# Patient Record
Sex: Male | Born: 1965
Health system: Southern US, Community
[De-identification: ages and names within clinical notes are randomized; demographics above are authoritative.]

## PROBLEM LIST (undated history)

## (undated) DIAGNOSIS — I10 Essential (primary) hypertension: Secondary | ICD-10-CM

## (undated) DIAGNOSIS — G629 Polyneuropathy, unspecified: Secondary | ICD-10-CM

## (undated) DIAGNOSIS — E119 Type 2 diabetes mellitus without complications: Secondary | ICD-10-CM

---

## 2013-12-03 ENCOUNTER — Encounter: Payer: Self-pay | Admitting: Emergency Medicine

## 2013-12-03 ENCOUNTER — Emergency Department (INDEPENDENT_AMBULATORY_CARE_PROVIDER_SITE_OTHER): Payer: Worker's Compensation

## 2013-12-03 ENCOUNTER — Emergency Department (INDEPENDENT_AMBULATORY_CARE_PROVIDER_SITE_OTHER)
Admission: EM | Admit: 2013-12-03 | Discharge: 2013-12-03 | Disposition: A | Discharge status: Home / Self Care | Payer: Worker's Compensation | Source: Home / Self Care | Attending: Emergency Medicine | Admitting: Emergency Medicine

## 2013-12-03 DIAGNOSIS — M25569 Pain in unspecified knee: Secondary | ICD-10-CM

## 2013-12-03 DIAGNOSIS — S8000XA Contusion of unspecified knee, initial encounter: Secondary | ICD-10-CM

## 2013-12-03 DIAGNOSIS — S8002XA Contusion of left knee, initial encounter: Secondary | ICD-10-CM

## 2013-12-03 HISTORY — DX: Essential (primary) hypertension: I10

## 2013-12-03 HISTORY — DX: Polyneuropathy, unspecified: G62.9

## 2013-12-03 HISTORY — DX: Type 2 diabetes mellitus without complications: E11.9

## 2013-12-03 MED ORDER — HYDROCODONE-ACETAMINOPHEN 5-325 MG PO TABS: 1.0000 | ORAL_TABLET | ORAL | Status: DC | PRN

## 2013-12-03 MED ORDER — MELOXICAM 7.5 MG PO TABS: ORAL_TABLET | ORAL | Status: DC

## 2013-12-03 NOTE — ED Notes (Signed)
Pt was working today and was on the back of a truck when it pulled off.  Pt was "forced to jump to the loading dock and when he did, he hit his L knee on the dock.  Pt states this happened at 9-930 AM today.  Pain described as sharp shooting stabbing pain 9/10.  Pain does not radiate it is located only in the L knee.

## 2013-12-03 NOTE — ED Provider Notes (Signed)
CSN: [id number]     Arrival date & time 12/03/13  1446 History   First MD Initiated Contact with Patient 12/03/13 1453     Chief Complaint  Patient presents with  . Knee Injury    L knee at work   [facility] Saturday 12/03/2013. Patient works for [employer] and was referred here by his employer for evaluation and treatment of this on the job injury.  Pt was working today and was on the back of a truck, unloading the truck, when the truck unexpectedly pulled off. Patient states: "I was forced to jump to the loading dock and when I did, I hit my L knee on the dock."  Pt states this happened at 9-930 AM today.  Pain describes sharp shooting stabbing pain 9/10. Pain does not radiate. It is located only in the L knee  Patient is a 48 y.o. male presenting with knee pain.  Knee Pain Location:  Knee Time since incident:  6 hours Injury: yes   Mechanism of injury: fall   Fall:    Fall occurred: On the loading dock.   Point of impact: Left knee.   Entrapped after fall: no   Knee location:  L knee (Especially hurts medial aspect left knee) Pain details:    Quality:  Sharp and shooting (And stabbing)   Radiates to:  Does not radiate   Severity:  Severe (9/10)   Progression:  Unchanged Chronicity:  New Dislocation: no   Foreign body present:  No foreign bodies Prior injury to area:  No Relieved by:  Nothing Worsened by:  Activity, bearing weight, flexion and extension Ineffective treatments:  None tried Associated symptoms: decreased ROM, stiffness and swelling   Associated symptoms: no back pain, no fever, no muscle weakness, no neck pain, no numbness and no tingling   Risk factors: no known bone disorder and no recent illness      Past Medical History  Diagnosis Date  . Diabetes mellitus without complication   . Hypertension   . Neuropathy    History reviewed. No pertinent past surgical history. History reviewed. No pertinent family history. History   Substance Use Topics  . Smoking status: Never Smoker   . Smokeless tobacco: Never Used  . Alcohol Use: No    Review of Systems  Constitutional: Negative for fever.  Musculoskeletal: Positive for stiffness. Negative for back pain and neck pain.  All other systems reviewed and are negative.   see also history of present illness  Allergies  Review of patient's allergies indicates no known allergies.  Home Medications   Current Outpatient Rx  Name  Route  Sig  Dispense  Refill  . gabapentin (NEURONTIN) 100 MG capsule   Oral   Take 100 mg by mouth 3 (three) times daily.         Marland Kitchen. lisinopril (PRINIVIL,ZESTRIL) 10 MG tablet   Oral   Take 20 mg by mouth daily.         . metFORMIN (GLUCOPHAGE) 1000 MG tablet   Oral   Take 1,000 mg by mouth 2 (two) times daily with a meal.         . potassium chloride (K-DUR,KLOR-CON) 10 MEQ tablet   Oral   Take 10 mEq by mouth 2 (two) times daily.         Marland Kitchen. HYDROcodone-acetaminophen (NORCO/VICODIN) 5-325 MG per tablet   Oral   Take 1-2 tablets by mouth every 4 (four) hours as needed for severe  pain. Take with food.   12 tablet   0   . meloxicam (MOBIC) 7.5 MG tablet      Take 1 twice a day as needed for pain. Take with food. (Do not take with any other NSAID.)   20 tablet   0    BP 154/92  Pulse 104  Temp(Src) 97.9 F (36.6 C) (Oral)  Ht 5\' 10"  (1.778 m)  SpO2 95% Physical Exam  Nursing note and vitals reviewed. Constitutional: He is oriented to person, place, and time. He appears well-developed and well-nourished. No distress.  Alert, cooperative, overweight male, in moderate discomfort from left knee pain. When he tries to weight-bear, he has severe left knee pain. He moves slowly to avoid moving right knee. He can partially weight-bear, only with severe pain.   HENT:  Head: Normocephalic and atraumatic.  Eyes: Conjunctivae and EOM are normal. Pupils are equal, round, and reactive to light. No scleral icterus.  Neck:  Normal range of motion.  Cardiovascular: Normal rate.   Pulmonary/Chest: Effort normal.  Abdominal: He exhibits no distension.  Musculoskeletal:       Left knee: He exhibits decreased range of motion (markedly decreased range of motion) and swelling. He exhibits no effusion, no ecchymosis, no deformity, no laceration, no erythema, no LCL laxity and normal patellar mobility. Tenderness (Severe tenderness, Anterior and medial) found. Medial joint line tenderness noted. No lateral joint line and no patellar tendon tenderness noted.       Cervical back: He exhibits no tenderness, no bony tenderness and no deformity.       Thoracic back: He exhibits no tenderness, no bony tenderness and no deformity.       Lumbar back: He exhibits no tenderness, no bony tenderness and no deformity.       Left lower leg: He exhibits no tenderness.  Because of severe pain and tenderness left medial knee, I could not completely assess whether or not there was any ligamentous instability. From my examination, there was no definite ligamentous instability. Negative McMurray's sign. Negative Lachman sign.  Neurological: He is alert and oriented to person, place, and time.  Skin: Skin is warm.  Psychiatric: He has a normal mood and affect.   Left knee without any open wound or discoloration. There is exquisite point tenderness left medial knee. Decreased range of motion left knee. Lachman's and McMurray's test negative.  There is1+ edema with 3+ varicose veins both legs, which he states is chronic. No calf tenderness or cords or heat. Negative Homans sign. Neurovascular distally both lower extremities intact. ED Course  Procedures  Imaging Review Dg Knee Complete 4 Views Left  12/03/2013   CLINICAL DATA:  Pain post trauma  EXAM: LEFT KNEE - COMPLETE 4+ VIEW  COMPARISON:  None.  FINDINGS: Frontal, lateral, and bilateral oblique views were obtained. There is no fracture or dislocation. There is no appreciable effusion.  There is moderately severe narrowing medially and in the patellofemoral joint region. There is spurring medially and in the patellofemoral joint. There is a small bone island in the proximal tibia.  IMPRESSION: Osteoarthritic change.  No fracture or appreciable joint effusion.   Electronically Signed   By: Bretta Bang M.D.   On: 12/03/2013 16:16    MDM   1. Contusion of left knee    Treatment options discussed, as well as risks, benefits, alternatives. Patient voiced understanding and agreement with the following plans: We applied an elastic knee sleeve/brace. Advised ice and elevation of left lower  extremity x24 hours. Written prescriptions given: Vicodin 5/325. #12. No refills. 1 or 2 every 4 hours when necessary severe pain. Precautions discussed that this may cause drowsiness.--He understands not to take this while working or driving. Meloxicam 7.5 mg. #20. No refills. 1 by mouth twice a day with food for pain/inflammation.  Worker's Comp Information:  I completed the [employer] Report, please see that sheet for further specific information. I made a photocopy of that report, and gave patient the original to give to his employer.  Return To Work:  Monday, 12/05/2013   Work Restrictions:  Light/sit down work only. 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  FOLLOW-UP APPOINTMENT:  I gave him appointment time Tues 12/06/13 9 AM at: [facility] [phone] [address], [address]  [place], [place] [address]   Referral (if indicated): I did not make any other referral at this time. At his followup appointment at employer health services, the provider there may elect to refer to an orthopedic specialist if symptoms not improving.   Lajean Manes, MD 12/03/13 1911

## 2015-10-31 ENCOUNTER — Ambulatory Visit (INDEPENDENT_AMBULATORY_CARE_PROVIDER_SITE_OTHER): Payer: BLUE CROSS/BLUE SHIELD | Admitting: Sports Medicine

## 2015-10-31 ENCOUNTER — Encounter: Payer: Self-pay | Admitting: Sports Medicine

## 2015-10-31 DIAGNOSIS — M79672 Pain in left foot: Secondary | ICD-10-CM | POA: Diagnosis not present

## 2015-10-31 DIAGNOSIS — L600 Ingrowing nail: Secondary | ICD-10-CM | POA: Diagnosis not present

## 2015-10-31 DIAGNOSIS — E1142 Type 2 diabetes mellitus with diabetic polyneuropathy: Secondary | ICD-10-CM

## 2015-10-31 DIAGNOSIS — B351 Tinea unguium: Secondary | ICD-10-CM

## 2015-10-31 DIAGNOSIS — M79671 Pain in right foot: Secondary | ICD-10-CM

## 2015-10-31 NOTE — Progress Notes (Signed)
Patient ID: Scott Anderson, male   DOB: Jan 18, 1966, 50 y.o.   MRN: [id number] Subjective: Scott Anderson is a 50 y.o. male patient with history of type 2 diabetes who presents to office today complaining of long, painful nails  while ambulating in shoes; unable to trim. Patient states that the glucose reading this morning was >200 mg/dl. Diagnosed with DM <10 years ago. Patient denies any new changes in medication or new problems. Patient denies any new cramping, numbness, burning or tingling in the legs. Admits to chronic neuropathic pain for over 5 years.  There are no active problems to display for this patient.  Current Outpatient Prescriptions on File Prior to Visit  Medication Sig Dispense Refill  . gabapentin (NEURONTIN) 100 MG capsule Take 100 mg by mouth 3 (three) times daily.    Marland Kitchen lisinopril (PRINIVIL,ZESTRIL) 10 MG tablet Take 20 mg by mouth daily.    . metFORMIN (GLUCOPHAGE) 1000 MG tablet Take 1,000 mg by mouth 2 (two) times daily with a meal.    . potassium chloride (K-DUR,KLOR-CON) 10 MEQ tablet Take 10 mEq by mouth 2 (two) times daily.     No current facility-administered medications on file prior to visit.   No Known Allergies  No results found for this or any previous visit (from the past 2160 hour(s)).  Objective: General: Patient is awake, alert, and oriented x 3 and in no acute distress.  Integument: Skin is warm, dry and supple bilateral. Nails are tender, long, thickened and  dystrophic with subungual debris, consistent with onychomycosis, 1-5 bilateral. At right hallux medial and lateral nail margins there is slight incurvation and ingrowing with dry debris's with no surrounding signs of infection, consistent with early ingrowing nail. No open lesions or preulcerative lesions present bilateral. Remaining integument unremarkable.  Vasculature:  Dorsalis Pedis pulse 24 bilateral. Posterior Tibial pulse  1/4 bilateral.  Capillary fill time <3 sec 1-5 bilateral. Scant hair  growth to the level of the digits. Temperature gradient within normal limits. No varicosities present bilateral. No edema present bilateral.   Neurology: The patient has intact sensation measured with a 5.07/10g Semmes Weinstein Monofilament at all pedal sites bilateral . Vibratory sensation diminished bilateral with tuning fork. No Babinski sign present bilateral.   Musculoskeletal: No gross pedal deformities noted bilateral. Muscular strength 5/5 in all lower extremity muscular groups bilateral without pain or limitation on range of motion . No tenderness with calf compression bilateral.  Assessment and Plan: Problem List Items Addressed This Visit    None    Visit Diagnoses    Dermatophytosis of nail    -  Primary    Ingrown nail        right hallux    Diabetic polyneuropathy associated with type 2 diabetes mellitus (HCC)        Relevant Medications    NOVOLOG FLEXPEN 100 UNIT/ML FlexPen    LANTUS SOLOSTAR 100 UNIT/ML Solostar Pen    diazepam (VALIUM) 5 MG tablet    gabapentin (NEURONTIN) 300 MG capsule    lisinopril (PRINIVIL,ZESTRIL) 40 MG tablet    metFORMIN (GLUCOPHAGE-XR) 500 MG 24 hr tablet    Foot pain, bilateral           -Examined patient. -Discussed and educated patient on diabetic foot care, especially with  regards to the vascular, neurological and musculoskeletal systems.  -Stressed the importance of good glycemic control and the detriment of not  controlling glucose levels in relation to the foot. -Mechanically debrided all nails 1-5  bilateral using sterile nail nipper and filed with dremel without incident. Applied Neosporin and Band-Aid to right hallux nail and advised patient to closely monitor for reoccurrence of ingrown or worsening.  -Answered all patient questions -Patient to return as needed or in 3 months for at risk foot care -Patient advised to call the office if any problems or questions arise in the  Meantime.  Scott Anderson, DPM

## 2015-10-31 NOTE — Patient Instructions (Signed)
Diabetes and Foot Care Diabetes may cause you to have problems because of poor blood supply (circulation) to your feet and legs. This may cause the skin on your feet to become thinner, break easier, and heal more slowly. Your skin may become dry, and the skin may peel and crack. You may also have nerve damage in your legs and feet causing decreased feeling in them. You may not notice minor injuries to your feet that could lead to infections or more serious problems. Taking care of your feet is one of the most important things you can do for yourself.  HOME CARE INSTRUCTIONS  Wear shoes at all times, even in the house. Do not go barefoot. Bare feet are easily injured.  Check your feet daily for blisters, cuts, and redness. If you cannot see the bottom of your feet, use a mirror or ask someone for help.  Wash your feet with warm water (do not use hot water) and mild soap. Then pat your feet and the areas between your toes until they are completely dry. Do not soak your feet as this can dry your skin.  Apply a moisturizing lotion or petroleum jelly (that does not contain alcohol and is unscented) to the skin on your feet and to dry, brittle toenails. Do not apply lotion between your toes.  Trim your toenails straight across. Do not dig under them or around the cuticle. File the edges of your nails with an emery board or nail file.  Do not cut corns or calluses or try to remove them with medicine.  Wear clean socks or stockings every day. Make sure they are not too tight. Do not wear knee-high stockings since they may decrease blood flow to your legs.  Wear shoes that fit properly and have enough cushioning. To break in new shoes, wear them for just a few hours a day. This prevents you from injuring your feet. Always look in your shoes before you put them on to be sure there are no objects inside.  Do not cross your legs. This may decrease the blood flow to your feet.  If you find a minor scrape,  cut, or break in the skin on your feet, keep it and the skin around it clean and dry. These areas may be cleansed with mild soap and water. Do not cleanse the area with peroxide, alcohol, or iodine.  When you remove an adhesive bandage, be sure not to damage the skin around it.  If you have a wound, look at it several times a day to make sure it is healing.  Do not use heating pads or hot water bottles. They may burn your skin. If you have lost feeling in your feet or legs, you may not know it is happening until it is too late.  Make sure your health care provider performs a complete foot exam at least annually or more often if you have foot problems. Report any cuts, sores, or bruises to your health care provider immediately. SEEK MEDICAL CARE IF:   You have an injury that is not healing.  You have cuts or breaks in the skin.  You have an ingrown nail.  You notice redness on your legs or feet.  You feel burning or tingling in your legs or feet.  You have pain or cramps in your legs and feet.  Your legs or feet are numb.  Your feet always feel cold. SEEK IMMEDIATE MEDICAL CARE IF:   There is increasing redness,   swelling, or pain in or around a wound.  There is a red line that goes up your leg.  Pus is coming from a wound.  You develop a fever or as directed by your health care provider.  You notice a bad smell coming from an ulcer or wound.   This information is not intended to replace advice given to you by your health care provider. Make sure you discuss any questions you have with your health care provider.   Document Released: 08/15/2000 Document Revised: 04/20/2013 Document Reviewed: 01/25/2013 Elsevier Interactive Patient Education 2016 Elsevier Inc.  

## 2015-11-26 ENCOUNTER — Ambulatory Visit (INDEPENDENT_AMBULATORY_CARE_PROVIDER_SITE_OTHER): Payer: Worker's Compensation | Admitting: Endocrinology

## 2015-11-26 ENCOUNTER — Encounter: Payer: Self-pay | Admitting: Endocrinology

## 2015-11-26 VITALS — BP 137/86 | HR 100 | Temp 97.9°F | Ht 68.0 in | Wt 321.0 lb

## 2015-11-26 DIAGNOSIS — Z794 Long term (current) use of insulin: Secondary | ICD-10-CM

## 2015-11-26 DIAGNOSIS — I10 Essential (primary) hypertension: Secondary | ICD-10-CM

## 2015-11-26 DIAGNOSIS — E119 Type 2 diabetes mellitus without complications: Secondary | ICD-10-CM | POA: Insufficient documentation

## 2015-11-26 DIAGNOSIS — E1142 Type 2 diabetes mellitus with diabetic polyneuropathy: Secondary | ICD-10-CM

## 2015-11-26 DIAGNOSIS — F411 Generalized anxiety disorder: Secondary | ICD-10-CM

## 2015-11-26 DIAGNOSIS — E781 Pure hyperglyceridemia: Secondary | ICD-10-CM

## 2015-11-26 DIAGNOSIS — E039 Hypothyroidism, unspecified: Secondary | ICD-10-CM

## 2015-11-26 DIAGNOSIS — N529 Male erectile dysfunction, unspecified: Secondary | ICD-10-CM

## 2015-11-26 MED ORDER — INSULIN GLARGINE 100 UNIT/ML SOLOSTAR PEN: 130.0000 [IU] | PEN_INJECTOR | SUBCUTANEOUS | Status: DC

## 2015-11-26 NOTE — Patient Instructions (Addendum)
good diet and exercise significantly improve the control of your diabetes.  please let me know if you wish to be referred to a dietician.  high blood sugar is very risky to your health.  you should see an eye doctor and dentist every year.  It is very important to get all recommended vaccinations.  controlling your blood pressure and cholesterol drastically reduces the damage diabetes does to your body.  Those who smoke should quit.  please discuss these with your doctor.  check your blood sugar twice a day.  vary the time of day when you check, between before the 3 meals, and at bedtime.  also check if you have symptoms of your blood sugar being too high or too low.  please keep a record of the readings and bring it to your next appointment here (or you can bring the meter itself).  You can write it on any piece of paper.  please call us sooner if your blood sugar goes below 70, or if you have a lot of readings over 200.  Please consider having weight loss surgery.  It is good for your health.  Here is some information about it.  If you decide to consider further, please call the phone number in the papers, and register for a free informational meeting Please increase the lantus to 130 units each morning (you can take it all in the morning).   Please continue the same novolog for now.  Please call us in a few days, to tell us how the blood sugar is doing.   Please come back for a follow-up appointment in 1 month.

## 2015-11-26 NOTE — Progress Notes (Signed)
Subjective:    Patient ID: Scott Anderson, male    DOB: Aug 09, 1966, 50 y.o.   MRN: [id number]  HPI pt states DM was dx'ed in 2010; he has moderate neuropathy of the lower extremities, and assoc pain; he is unaware of any associated chronic complications; he has been on insulin since dx; pt says his diet and exercise are; he has never had pancreatitis, severe hypoglycemia or DKA.  He says cbg's vary from 200-400.  There is no trend throughout the day.   Past Medical History  Diagnosis Date  . Diabetes mellitus without complication (HCC)   . Hypertension   . Neuropathy (HCC)     No past surgical history on file.  Social History   Social History  . Marital Status: Married    Spouse Name: N/A  . Number of Children: N/A  . Years of Education: N/A   Occupational History  . Not on file.   Social History Main Topics  . Smoking status: Never Smoker   . Smokeless tobacco: Never Used  . Alcohol Use: No  . Drug Use: No  . Sexual Activity: Not on file   Other Topics Concern  . Not on file   Social History Narrative    Current Outpatient Prescriptions on File Prior to Visit  Medication Sig Dispense Refill  . diazepam (VALIUM) 5 MG tablet TK 1 T PO  TID PRN  0  . fenofibrate 160 MG tablet TK 1 T PO D  3  . gabapentin (NEURONTIN) 300 MG capsule 3 (three) times daily.   1  . levothyroxine (SYNTHROID, LEVOTHROID) 25 MCG tablet TK 1 T PO D  5  . lisinopril (PRINIVIL,ZESTRIL) 40 MG tablet TK 1 T PO D  5  . metFORMIN (GLUCOPHAGE) 1000 MG tablet Take 1,000 mg by mouth 2 (two) times daily with a meal.    . metoprolol tartrate (LOPRESSOR) 25 MG tablet   4  . NOVOLOG FLEXPEN 100 UNIT/ML FlexPen 23 units in the morning 30 units at night  4  . PROAIR HFA 108 (90 Base) MCG/ACT inhaler INL 2 PFS PO Q 6 H PRN  5  . potassium chloride (K-DUR,KLOR-CON) 10 MEQ tablet Take 10 mEq by mouth 2 (two) times daily. Reported on 11/26/2015     No current facility-administered medications on file prior to  visit.    No Known Allergies  Family History  Problem Relation Age of Onset  . Diabetes Mother     BP 137/86 mmHg  Pulse 100  Temp(Src) 97.9 F (36.6 C) (Oral)  Ht  (1.727 m)  Wt 321 lb (145.605 kg)  BMI 48.82 kg/m2  SpO2 97%  Review of Systems denies weight loss, blurry vision, chest pain, n/v, muscle cramps, excessive diaphoresis, cold intolerance, and easy bruising.  He has intermittent headache, polyuria, rhinorrhea, and DOE.  Wife says he has intermittent memory loss.     Objective:   Physical Exam VS: see vs page GEN: no distress.  Morbid obesity HEAD: head: no deformity eyes: no periorbital swelling, no proptosis external nose and ears are normal mouth: no lesion seen NECK: supple, thyroid is not enlarged CHEST WALL: no deformity LUNGS: clear to auscultation BREASTS:  No gynecomastia CV: reg rate and rhythm, no murmur ABD: abdomen is soft, nontender.  no hepatosplenomegaly.  not distended.  no hernia.   MUSCULOSKELETAL: muscle bulk and strength are grossly normal.  no obvious joint swelling.  gait is steady, with a cane.   EXTEMITIES: no  deformity.  no ulcer on the feet.  feet are of normal color and temp.  1+ bilat leg edema, bilat varicosities, and erythema of the legs.  There is bilateral onychomycosis of the toenails, and dry skin.  PULSES: dorsalis pedis intact bilat.  no carotid bruit.  NEURO:  cn 2-12 grossly intact.   readily moves all 4's.  sensation is intact to touch on the feet, but decreased from normal SKIN:  Normal texture and temperature.  No rash or suspicious lesion is visible.   NODES:  None palpable at the neck PSYCH: alert, well-oriented.  Does not appear anxious nor depressed.   outside test results are reviewed: A1c=13.7%  I have reviewed outside records, and summarized: Pt was noted to have severely elevated a1c, and referred here.      Assessment & Plan:  DM: severe exacerbation.   Morbid obesity: new to  me. Hypertriglyceridemia: this will improve with improvement in glycemic control HTN: well-controlled.  Please continue the same medication.  Patient is advised the following: Patient Instructions  good diet and exercise significantly improve the control of your diabetes.  please let me know if you wish to be referred to a dietician.  high blood sugar is very risky to your health.  you should see an eye doctor and dentist every year.  It is very important to get all recommended vaccinations.  controlling your blood pressure and cholesterol drastically reduces the damage diabetes does to your body.  Those who smoke should quit.  please discuss these with your doctor.  check your blood sugar twice a day.  vary the time of day when you check, between before the 3 meals, and at bedtime.  also check if you have symptoms of your blood sugar being too high or too low.  please keep a record of the readings and bring it to your next appointment here (or you can bring the meter itself).  You can write it on any piece of paper.  please call us sooner if your blood sugar goes below 70, or if you have a lot of readings over 200.  Please consider having weight loss surgery.  It is good for your health.  Here is some information about it.  If you decide to consider further, please call the phone number in the papers, and register for a free informational meeting Please increase the lantus to 130 units each morning (you can take it all in the morning).   Please continue the same novolog for now.  Please call us in a few days, to tell us how the blood sugar is doing.   Please come back for a follow-up appointment in 1 month.

## 2015-11-27 LAB — TESTOSTERONE,FREE AND TOTAL
TESTOSTERONE: 79 ng/dL — AB (ref 348–1197)
Testosterone, Free: 6.3 pg/mL — ABNORMAL LOW (ref 7.2–24.0)

## 2015-11-29 ENCOUNTER — Other Ambulatory Visit: Payer: Self-pay | Admitting: Endocrinology

## 2015-11-29 DIAGNOSIS — E291 Testicular hypofunction: Secondary | ICD-10-CM | POA: Insufficient documentation

## 2015-11-30 LAB — SPECIMEN STATUS REPORT

## 2015-12-04 LAB — PROLACTIN

## 2015-12-04 LAB — TSH: TSH: 5.89 u[IU]/mL — AB (ref 0.450–4.500)

## 2015-12-04 LAB — LUTEINIZING HORMONE

## 2015-12-04 LAB — SPECIMEN STATUS REPORT

## 2015-12-05 ENCOUNTER — Telehealth: Payer: Self-pay | Admitting: Endocrinology

## 2015-12-05 NOTE — Telephone Encounter (Signed)
Patient has a lab appointment this Friday and want to know why? And what is prolactin?

## 2015-12-05 NOTE — Telephone Encounter (Signed)
I contacted the pt and advise we are doing more blood test to try and determine why his testosterone was low from his recent blood test on 11/29/2015. Pt voiced understanding,.

## 2015-12-07 ENCOUNTER — Other Ambulatory Visit (INDEPENDENT_AMBULATORY_CARE_PROVIDER_SITE_OTHER): Payer: BLUE CROSS/BLUE SHIELD

## 2015-12-07 DIAGNOSIS — E291 Testicular hypofunction: Secondary | ICD-10-CM | POA: Diagnosis not present

## 2015-12-07 LAB — LUTEINIZING HORMONE: LH: 5.44 m[IU]/mL (ref 1.50–9.30)

## 2015-12-08 LAB — PROLACTIN: Prolactin: 6.5 ng/mL (ref 2.0–18.0)

## 2015-12-11 ENCOUNTER — Telehealth: Payer: Self-pay | Admitting: Endocrinology

## 2015-12-11 ENCOUNTER — Other Ambulatory Visit: Payer: Self-pay | Admitting: Endocrinology

## 2015-12-11 DIAGNOSIS — E230 Hypopituitarism: Secondary | ICD-10-CM

## 2015-12-11 NOTE — Telephone Encounter (Signed)
Please call pt back.

## 2015-12-11 NOTE — Telephone Encounter (Signed)
I contacted the pt and advise based on the recent blood test Dr. Everardo AllEllison would like to check a MRI of the brain to determine why the testosterone levels are low. Pt voiced understanding. Pt [facility] and will contact the pt to schedule. Pt voiced understanding.

## 2015-12-19 ENCOUNTER — Telehealth: Payer: Self-pay | Admitting: Endocrinology

## 2015-12-19 NOTE — Telephone Encounter (Signed)
error 

## 2015-12-28 ENCOUNTER — Ambulatory Visit
Admission: RE | Admit: 2015-12-28 | Discharge: 2015-12-28 | Disposition: A | Discharge status: Home / Self Care | Payer: BLUE CROSS/BLUE SHIELD | Source: Ambulatory Visit | Attending: Endocrinology | Admitting: Endocrinology

## 2015-12-28 ENCOUNTER — Ambulatory Visit: Payer: Self-pay | Admitting: Endocrinology

## 2015-12-28 DIAGNOSIS — E230 Hypopituitarism: Secondary | ICD-10-CM

## 2015-12-28 MED ORDER — GADOBENATE DIMEGLUMINE 529 MG/ML IV SOLN
10.0000 mL | Freq: Once | INTRAVENOUS | Status: AC | PRN
  Administered 2015-12-28: 10 mL via INTRAVENOUS

## 2015-12-29 ENCOUNTER — Other Ambulatory Visit: Payer: Self-pay | Admitting: Endocrinology

## 2015-12-29 MED ORDER — CLOMIPHENE CITRATE 50 MG PO TABS: ORAL_TABLET | ORAL | Status: DC

## 2016-01-09 ENCOUNTER — Encounter: Payer: Self-pay | Admitting: Endocrinology

## 2016-01-09 ENCOUNTER — Ambulatory Visit (INDEPENDENT_AMBULATORY_CARE_PROVIDER_SITE_OTHER): Payer: BLUE CROSS/BLUE SHIELD | Admitting: Endocrinology

## 2016-01-09 VITALS — BP 132/80 | HR 108 | Temp 97.8°F | Ht 68.0 in | Wt 316.0 lb

## 2016-01-09 DIAGNOSIS — E1142 Type 2 diabetes mellitus with diabetic polyneuropathy: Secondary | ICD-10-CM | POA: Diagnosis not present

## 2016-01-09 DIAGNOSIS — Z794 Long term (current) use of insulin: Secondary | ICD-10-CM

## 2016-01-09 MED ORDER — NOVOLOG FLEXPEN 100 UNIT/ML ~~LOC~~ SOPN: 40.0000 [IU] | PEN_INJECTOR | Freq: Two times a day (BID) | SUBCUTANEOUS | Status: DC

## 2016-01-09 NOTE — Patient Instructions (Addendum)
check your blood sugar twice a day.  vary the time of day when you check, between before the 3 meals, and at bedtime.  also check if you have symptoms of your blood sugar being too high or too low.  please keep a record of the readings and bring it to your next appointment here (or you can bring the meter itself).  You can write it on any piece of paper.  please call us sooner if your blood sugar goes below 70, or if you have a lot of readings over 200.  Please continue to pursue the weight loss surgery.   Please continue the same lantus, and:    Please increase the novolog to 40 units twice a day (with meals).   Please come back for a follow-up appointment in 1 month, when you'll be due to recheck the testosterone.

## 2016-01-09 NOTE — Progress Notes (Signed)
Subjective:    Patient ID: Scott Anderson, male    DOB: 12-Dec-1965, 50 y.o.   MRN: [id number]  HPI  Pt returns for f/u of diabetes mellitus: DM type: Insulin-requiring type 2 Dx'ed: 2010 Complications: painful polyneuropathy  Therapy: insulin since dx.  DKA: never Severe hypoglycemia: never Pancreatitis: never Other: he takes multiple daily injections Interval history: Since last ov, he has missed the insulin only once.  no cbg record, but states cbg's are persistently in the 200's.  No change in chronic bilat leg and foot pain.  He eats just 2 meals per day.   Past Medical History  Diagnosis Date  . Diabetes mellitus without complication (HCC)   . Hypertension   . Neuropathy (HCC)     No past surgical history on file.  Social History   Social History  . Marital Status: Married    Spouse Name: N/A  . Number of Children: N/A  . Years of Education: N/A   Occupational History  . Not on file.   Social History Main Topics  . Smoking status: Never Smoker   . Smokeless tobacco: Never Used  . Alcohol Use: No  . Drug Use: No  . Sexual Activity: Not on file   Other Topics Concern  . Not on file   Social History Narrative    Current Outpatient Prescriptions on File Prior to Visit  Medication Sig Dispense Refill  . clomiPHENE (CLOMID) 50 MG tablet 1/4 tab daily 10 tablet 3  . diazepam (VALIUM) 5 MG tablet TK 1 T PO  TID PRN  0  . fenofibrate 160 MG tablet TK 1 T PO D  3  . gabapentin (NEURONTIN) 300 MG capsule 3 (three) times daily.   1  . Insulin Glargine (LANTUS SOLOSTAR) 100 UNIT/ML Solostar Pen Inject 130 Units into the skin every morning. And pen needles 4/day 15 pen PRN  . levothyroxine (SYNTHROID, LEVOTHROID) 25 MCG tablet TK 1 T PO D  5  . lisinopril (PRINIVIL,ZESTRIL) 40 MG tablet TK 1 T PO D  5  . metFORMIN (GLUCOPHAGE) 1000 MG tablet Take 1,000 mg by mouth 2 (two) times daily with a meal.    . metoprolol tartrate (LOPRESSOR) 25 MG tablet   4  . potassium  chloride (K-DUR,KLOR-CON) 10 MEQ tablet Take 10 mEq by mouth 2 (two) times daily. Reported on 11/26/2015    . PROAIR HFA 108 (90 Base) MCG/ACT inhaler INL 2 PFS PO Q 6 H PRN  5   No current facility-administered medications on file prior to visit.    No Known Allergies  Family History  Problem Relation Age of Onset  . Diabetes Mother     BP 132/80 mmHg  Pulse 108  Temp(Src) 97.8 F (36.6 C) (Oral)  Ht 5\' 8"  (1.727 m)  Wt 316 lb (143.337 kg)  BMI 48.06 kg/m2  SpO2 95%   Review of Systems He denies hypoglycemia    Objective:   Physical Exam VITAL SIGNS:  See vs page GENERAL: no distress SKIN:  Insulin injection sites at the anterior abdomen are normal     Assessment & Plan:  DM: he needs increased rx.    Patient is advised the following: Patient Instructions  check your blood sugar twice a day.  vary the time of day when you check, between before the 3 meals, and at bedtime.  also check if you have symptoms of your blood sugar being too high or too low.  please keep a record of  the readings and bring it to your next appointment here (or you can bring the meter itself).  You can write it on any piece of paper.  please call us sooner if your blood sugar goes below 70, or if you have a lot of readings over 200.  Please continue to pursue the weight loss surgery.   Please continue the same lantus, and:    Please increase the novolog to 40 units twice a day (with meals).   Please come back for a follow-up appointment in 1 month, when you'll be due to recheck the testosterone.

## 2016-01-31 ENCOUNTER — Ambulatory Visit (INDEPENDENT_AMBULATORY_CARE_PROVIDER_SITE_OTHER): Payer: BLUE CROSS/BLUE SHIELD | Admitting: Sports Medicine

## 2016-01-31 ENCOUNTER — Encounter: Payer: Self-pay | Admitting: Sports Medicine

## 2016-01-31 DIAGNOSIS — B351 Tinea unguium: Secondary | ICD-10-CM

## 2016-01-31 DIAGNOSIS — E1142 Type 2 diabetes mellitus with diabetic polyneuropathy: Secondary | ICD-10-CM

## 2016-01-31 DIAGNOSIS — M79672 Pain in left foot: Secondary | ICD-10-CM

## 2016-01-31 DIAGNOSIS — M79671 Pain in right foot: Secondary | ICD-10-CM

## 2016-01-31 NOTE — Progress Notes (Signed)
Patient ID: Scott Anderson, male   DOB: Dec 19, 1965, 50 y.o.   MRN: [id number]   Subjective: Scott Anderson is a 50 y.o. male patient with history of type 2 diabetes who returns to office today complaining of long, painful nails  while ambulating in shoes; unable to trim. Patient states that the glucose reading this morning was around 200; reports that he has to go see an endocrinologist. Patient denies any new changes in medication or new problems. Patient denies any new cramping, numbness, burning or tingling in the legs.   Patient Active Problem List   Diagnosis Date Noted  . Pituitary insufficiency (HCC) 12/11/2015  . Hypogonadism male 11/29/2015  . Erectile dysfunction 11/26/2015  . Morbid obesity (HCC) 11/26/2015  . Diabetes (HCC) 11/26/2015  . HTN (hypertension) 11/26/2015  . Hypothyroidism 11/26/2015  . Anxiety state 11/26/2015  . Hypertriglyceridemia 11/26/2015   Current Outpatient Prescriptions on File Prior to Visit  Medication Sig Dispense Refill  . clomiPHENE (CLOMID) 50 MG tablet 1/4 tab daily 10 tablet 3  . diazepam (VALIUM) 5 MG tablet TK 1 T PO  TID PRN  0  . fenofibrate 160 MG tablet TK 1 T PO D  3  . gabapentin (NEURONTIN) 300 MG capsule 3 (three) times daily.   1  . Insulin Glargine (LANTUS SOLOSTAR) 100 UNIT/ML Solostar Pen Inject 130 Units into the skin every morning. And pen needles 4/day 15 pen PRN  . levothyroxine (SYNTHROID, LEVOTHROID) 25 MCG tablet TK 1 T PO D  5  . lisinopril (PRINIVIL,ZESTRIL) 40 MG tablet TK 1 T PO D  5  . metFORMIN (GLUCOPHAGE) 1000 MG tablet Take 1,000 mg by mouth 2 (two) times daily with a meal.    . metoprolol tartrate (LOPRESSOR) 25 MG tablet   4  . NOVOLOG FLEXPEN 100 UNIT/ML FlexPen Inject 40 Units into the skin 2 (two) times daily with a meal. 30 mL 4  . potassium chloride (K-DUR,KLOR-CON) 10 MEQ tablet Take 10 mEq by mouth 2 (two) times daily. Reported on 11/26/2015    . PROAIR HFA 108 (90 Base) MCG/ACT inhaler INL 2 PFS PO Q 6 H PRN   5   No current facility-administered medications on file prior to visit.   No Known Allergies  Recent Results (from the past 2160 hour(s))  Testosterone,Free and Total     Status: Abnormal   Collection Time: 11/26/15  4:02 PM  Result Value Ref Range   Testosterone 79 (L) 348 - 1197 ng/dL   Comment, Testosterone Comment     Comment: Adult male reference interval is based on a population of lean males up to 52 years old.    Testosterone, Free 6.3 (L) 7.2 - 24.0 pg/mL  Specimen status report     Status: None   Collection Time: 11/26/15  4:02 PM  Result Value Ref Range   specimen status report Comment     Comment: Verbal Order See below: Comment: Please provide requested information and fax to [phone] or [phone]. The [place] Code of [employer] requires a written and signed request be forwarded to a laboratory following a verbal order of a laboratory test.  Please assist [place] to meet this requirement and to complete our records. Date:______________________________ ICD-9/10 Diagnosis Code(s):___________________________________________ Physician or Authorized Designee:_____________________________________  Please Print Physician or Authorized Designee Signature: ______________________________________________________________________ Publishing copy Your Order Of The Test(s) Listed Additional Test(s) Requested Comment: Test(s) added per penny moore at account 11-30-2015 Logged by Anda Kraft Test# [id number] TSH Test# [id number] Prolactin Test# [id number] Luteinizing Hormone(LH), S Diagnosis Codes Provided     N52.9   TSH     Status: Abnormal   Collection Time: 11/26/15  4:02 PM  Result Value Ref Range   TSH 5.890 (H) 0.450 - 4.500 uIU/mL  Prolactin     Status: None   Collection Time: 11/26/15  4:02 PM  Result Value Ref Range   Prolactin CANCELED ng/mL    Comment: Quantity was not sufficient for  add-on test.  Result canceled by the ancillary   Luteinizing hormone     Status: None   Collection Time: 11/26/15  4:02 PM  Result Value Ref Range   LH CANCELED mIU/mL    Comment: Quantity was not sufficient for add-on test.  Result canceled by the ancillary   Specimen status report     Status: None   Collection Time: 11/26/15  4:02 PM  Result Value Ref Range   specimen status report Comment     Comment: Written Authorization Written Authorization Written Authorization Received. Authorization received from [facility] LPN [phone] Logged by Harl Bowie   Prolactin     Status: None   Collection Time: 12/07/15  2:15 PM  Result Value Ref Range   Prolactin 6.5 2.0 - 18.0 ng/mL  Luteinizing hormone     Status: None   Collection Time: 12/07/15  2:15 PM  Result Value Ref Range   LH 5.44 1.50 - 9.30 mIU/mL    Comment: Male Reference Range:20-70 yrs     1.5-9.3 mIU/mL>70 yrs       3.1-35.6 mIU/mLFemale Reference Range:Follicular Phase     1.9-12.5 mIU/mLMidcycle             8.7-76.3 mIU/mLLuteal Phase         0.5-16.9 mIU/mL  Post Menopausal      15.9-54.0  mIU/mLPregnant             <1.5 mIU/mLContraceptives       0.7-5.6 mIU/mL     Objective: General: Patient is awake, alert, and oriented x 3 and in no acute distress.  Integument: Skin is warm, dry and supple bilateral. Nails are tender, long, thickened and  dystrophic with subungual debris, consistent with onychomycosis, 1-5 bilateral. At right hallux lateral nail margins there is slight incurvation and ingrowing with dry debris's with no surrounding signs of infection, consistent with early ingrowing nail. No open lesions or preulcerative lesions present bilateral. Remaining integument unremarkable.  Vasculature:  Dorsalis Pedis pulse 2/4 bilateral. Posterior Tibial pulse  1/4 bilateral.  Capillary fill time <3 sec 1-5 bilateral. Scant hair growth to the level of the digits. Temperature gradient within normal limits. No  varicosities present bilateral. No edema present bilateral.   Neurology: The patient has intact sensation measured with a 5.07/10g Semmes Weinstein Monofilament at all pedal sites bilateral . Vibratory sensation diminished bilateral with tuning fork. No Babinski sign present bilateral.   Musculoskeletal: No gross pedal deformities noted bilateral. Muscular strength 5/5 in all lower extremity muscular groups bilateral without pain or limitation on range of motion . No tenderness with calf compression bilateral.  Assessment and Plan: Problem List Items Addressed This Visit    None    Visit Diagnoses    Dermatophytosis of nail    -  Primary  Diabetic polyneuropathy associated with type 2 diabetes mellitus (HCC)        Foot pain, bilateral          -Examined patient. -Discussed and educated patient on diabetic foot care, especially with  regards to the vascular, neurological and musculoskeletal systems.  -Stressed the importance of good glycemic control and the detriment of not  controlling glucose levels in relation to the foot. -Mechanically debrided all nails 1-5 bilateral using sterile nail nipper and filed with dremel without incident. Removed right hallux offending lateral nail margin and applied silver nitrate to bleeding area and advised patient to soak and watch for re-occurrence if happens to return to office immediately for possible need for more aggressive nail procedure.  -Answered all patient questions -Patient to return as needed or in 3 months for at risk foot care -Patient advised to call the office if any problems or questions arise in the meantime.  Asencion Islamitorya Pinky Ravan, DPM

## 2016-02-11 ENCOUNTER — Ambulatory Visit (INDEPENDENT_AMBULATORY_CARE_PROVIDER_SITE_OTHER): Payer: BLUE CROSS/BLUE SHIELD | Admitting: Endocrinology

## 2016-02-11 ENCOUNTER — Encounter: Payer: Self-pay | Admitting: Endocrinology

## 2016-02-11 VITALS — BP 135/77 | HR 94 | Temp 98.0°F | Ht 68.0 in | Wt 316.2 lb

## 2016-02-11 DIAGNOSIS — E1142 Type 2 diabetes mellitus with diabetic polyneuropathy: Secondary | ICD-10-CM | POA: Diagnosis not present

## 2016-02-11 DIAGNOSIS — E291 Testicular hypofunction: Secondary | ICD-10-CM | POA: Diagnosis not present

## 2016-02-11 DIAGNOSIS — E781 Pure hyperglyceridemia: Secondary | ICD-10-CM | POA: Diagnosis not present

## 2016-02-11 DIAGNOSIS — E039 Hypothyroidism, unspecified: Secondary | ICD-10-CM | POA: Diagnosis not present

## 2016-02-11 DIAGNOSIS — Z794 Long term (current) use of insulin: Secondary | ICD-10-CM

## 2016-02-11 MED ORDER — NOVOLOG FLEXPEN 100 UNIT/ML ~~LOC~~ SOPN: 60.0000 [IU] | PEN_INJECTOR | Freq: Two times a day (BID) | SUBCUTANEOUS | Status: DC

## 2016-02-11 NOTE — Progress Notes (Signed)
Subjective:    Patient ID: Scott Anderson, male    DOB: Mar 12, 1966, 50 y.o.   MRN: [id number]  HPI Pt returns for f/u of diabetes mellitus: DM type: Insulin-requiring type 2 Dx'ed: 2010 Complications: painful polyneuropathy  Therapy: insulin since dx.  DKA: never Severe hypoglycemia: never Pancreatitis: never.  Other: he takes multiple daily injections.   Interval history: no cbg record, but states cbg's are persistently in the 300's.  There is no trend throughout the day.  He seldom misses the insulin.  pt states he feels well in general, except for fatigue.   Past Medical History  Diagnosis Date  . Diabetes mellitus without complication (HCC)   . Hypertension   . Neuropathy (HCC)     No past surgical history on file.  Social History   Social History  . Marital Status: Married    Spouse Name: N/A  . Number of Children: N/A  . Years of Education: N/A   Occupational History  . Not on file.   Social History Main Topics  . Smoking status: Never Smoker   . Smokeless tobacco: Never Used  . Alcohol Use: No  . Drug Use: No  . Sexual Activity: Not on file   Other Topics Concern  . Not on file   Social History Narrative    Current Outpatient Prescriptions on File Prior to Visit  Medication Sig Dispense Refill  . clomiPHENE (CLOMID) 50 MG tablet 1/4 tab daily 10 tablet 3  . diazepam (VALIUM) 5 MG tablet TK 1 T PO  TID PRN  0  . fenofibrate 160 MG tablet TK 1 T PO D  3  . gabapentin (NEURONTIN) 300 MG capsule 3 (three) times daily.   1  . Insulin Glargine (LANTUS SOLOSTAR) 100 UNIT/ML Solostar Pen Inject 130 Units into the skin every morning. And pen needles 4/day 15 pen PRN  . lisinopril (PRINIVIL,ZESTRIL) 40 MG tablet TK 1 T PO D  5  . metFORMIN (GLUCOPHAGE) 1000 MG tablet Take 1,000 mg by mouth 2 (two) times daily with a meal.    . metoprolol tartrate (LOPRESSOR) 25 MG tablet   4  . potassium chloride (K-DUR,KLOR-CON) 10 MEQ tablet Take 10 mEq by mouth 2 (two)  times daily. Reported on 11/26/2015    . PROAIR HFA 108 (90 Base) MCG/ACT inhaler INL 2 PFS PO Q 6 H PRN  5   No current facility-administered medications on file prior to visit.    No Known Allergies  Family History  Problem Relation Age of Onset  . Diabetes Mother     BP 135/77 mmHg  Pulse 94  Temp(Src) 98 F (36.7 C) (Oral)  Ht  (1.727 m)  Wt 316 lb 3.2 oz (143.427 kg)  BMI 48.09 kg/m2  SpO2 96%  Review of Systems He denies hypoglycemia.      Objective:   Physical Exam VITAL SIGNS:  See vs page GENERAL: no distress.  Morbid obesity.  Pulses: dorsalis pedis intact bilat.   MSK: no deformity of the feet CV: 1+ bilat leg edema, bilat varicosities, and hyperpigmentation of the legs. Skin:  no ulcer on the feet.  normal color and temp on the feet. Neuro: sensation is intact to touch on the feet, but decreased from normal.  A1c=12.3% Lab Results  Component Value Date   TESTOSTERONE 168* 02/11/2016   Lab Results  Component Value Date   TSH 4.54* 02/11/2016   Lab Results  Component Value Date   CHOL 248* 02/11/2016  HDL 23.40* 02/11/2016   LDLDIRECT 90.0 02/11/2016   TRIG * 02/11/2016    1141.0 Triglyceride is over 400; calculations on Lipids are invalid.   CHOLHDL 11 02/11/2016       Assessment & Plan:  Insulin-requiring type 2 DM:  Improved, but still poor control.   Hypertriglyceridemia: despite this not being fasting, still not well-controlled.  This will improve further with glycemic control.  Hypothyroidism: he needs increased rx.  i have sent a prescription to your pharmacy, to increase synthroid.   Hypogonadism: well-controlled.  Please continue the same medication  Patient is advised the following: Patient Instructions  check your blood sugar twice a day.  vary the time of day when you check, between before the 3 meals, and at bedtime.  also check if you have symptoms of your blood sugar being too high or too low.  please keep a record of the  readings and bring it to your next appointment here (or you can bring the meter itself).  You can write it on any piece of paper.  please call us sooner if your blood sugar goes below 70, or if you have a lot of readings over 200.  Please continue to pursue the weight loss surgery.   blood tests are requested for you today.  We'll let you know about the results.    Please continue the same lantus, and:    Please increase the novolog to 60 units twice a day (with meals).   Please come back for a follow-up appointment in 1 month.    Romero BellingELLISON, Noriko Macari, MD

## 2016-02-11 NOTE — Patient Instructions (Addendum)
check your blood sugar twice a day.  vary the time of day when you check, between before the 3 meals, and at bedtime.  also check if you have symptoms of your blood sugar being too high or too low.  please keep a record of the readings and bring it to your next appointment here (or you can bring the meter itself).  You can write it on any piece of paper.  please call us sooner if your blood sugar goes below 70, or if you have a lot of readings over 200.  Please continue to pursue the weight loss surgery.   blood tests are requested for you today.  We'll let you know about the results.    Please continue the same lantus, and:    Please increase the novolog to 60 units twice a day (with meals).   Please come back for a follow-up appointment in 1 month.

## 2016-02-11 NOTE — Progress Notes (Signed)
Pre visit review using our clinic tool,if applicable. No additional management support is needed unless otherwise documented below in the visit note.  

## 2016-02-12 LAB — LIPID PANEL
CHOLESTEROL: 248 mg/dL — AB (ref 0–200)
HDL: 23.4 mg/dL — ABNORMAL LOW (ref >39.00)
Total CHOL/HDL Ratio: 11
Triglycerides: 1141 mg/dL — ABNORMAL HIGH (ref 0.0–149.0)

## 2016-02-12 LAB — TESTOSTERONE,FREE AND TOTAL
TESTOSTERONE FREE: 10.3 pg/mL (ref 7.2–24.0)
TESTOSTERONE: 168 ng/dL — AB (ref 348–1197)

## 2016-02-12 LAB — LDL CHOLESTEROL, DIRECT: Direct LDL: 90 mg/dL

## 2016-02-12 LAB — TSH: TSH: 4.54 u[IU]/mL — AB (ref 0.35–4.50)

## 2016-02-12 MED ORDER — LEVOTHYROXINE SODIUM 50 MCG PO TABS: 50.0000 ug | ORAL_TABLET | Freq: Every day | ORAL | Status: DC

## 2016-02-13 ENCOUNTER — Telehealth: Payer: Self-pay | Admitting: Endocrinology

## 2016-02-13 NOTE — Telephone Encounter (Signed)
Pt has question What was

## 2016-02-13 NOTE — Telephone Encounter (Signed)
Error  Pt was asking the specific number of what the testosterone was, i gave it to him

## 2016-03-12 ENCOUNTER — Ambulatory Visit (INDEPENDENT_AMBULATORY_CARE_PROVIDER_SITE_OTHER): Payer: BLUE CROSS/BLUE SHIELD | Admitting: Endocrinology

## 2016-03-12 ENCOUNTER — Encounter: Payer: Self-pay | Admitting: Endocrinology

## 2016-03-12 VITALS — BP 122/70 | HR 95 | Ht 68.0 in | Wt 325.0 lb

## 2016-03-12 DIAGNOSIS — E1142 Type 2 diabetes mellitus with diabetic polyneuropathy: Secondary | ICD-10-CM

## 2016-03-12 DIAGNOSIS — Z794 Long term (current) use of insulin: Secondary | ICD-10-CM

## 2016-03-12 MED ORDER — INSULIN GLARGINE 100 UNIT/ML SOLOSTAR PEN: 180.0000 [IU] | PEN_INJECTOR | SUBCUTANEOUS | Status: DC

## 2016-03-12 NOTE — Progress Notes (Signed)
Subjective:    Patient ID: Scott Anderson, male    DOB: 13-Jun-1966, 50 y.o.   MRN: [id number]  HPI Pt returns for f/u of diabetes mellitus: DM type: Insulin-requiring type 2 Dx'ed: 2010 Complications: painful polyneuropathy  Therapy: insulin since dx.  DKA: never Severe hypoglycemia: never Pancreatitis: never.  Other: he takes multiple daily injections.   Interval history: no cbg record, but states cbg's are persistently in the 200's.  There is no trend throughout the day.  He seldom misses the insulin.  pt states he feels well in general, except for fatigue.  Past Medical History  Diagnosis Date  . Diabetes mellitus without complication (HCC)   . Hypertension   . Neuropathy (HCC)     No past surgical history on file.  Social History   Social History  . Marital Status: Married    Spouse Name: N/A  . Number of Children: N/A  . Years of Education: N/A   Occupational History  . Not on file.   Social History Main Topics  . Smoking status: Never Smoker   . Smokeless tobacco: Never Used  . Alcohol Use: No  . Drug Use: No  . Sexual Activity: Not on file   Other Topics Concern  . Not on file   Social History Narrative    Current Outpatient Prescriptions on File Prior to Visit  Medication Sig Dispense Refill  . clomiPHENE (CLOMID) 50 MG tablet 1/4 tab daily 10 tablet 3  . diazepam (VALIUM) 5 MG tablet TK 1 T PO  TID PRN  0  . fenofibrate 160 MG tablet TK 1 T PO D  3  . gabapentin (NEURONTIN) 300 MG capsule 3 (three) times daily.   1  . levothyroxine (SYNTHROID, LEVOTHROID) 50 MCG tablet Take 1 tablet (50 mcg total) by mouth daily before breakfast. 90 tablet 3  . lisinopril (PRINIVIL,ZESTRIL) 40 MG tablet TK 1 T PO D  5  . metFORMIN (GLUCOPHAGE) 1000 MG tablet Take 1,000 mg by mouth 2 (two) times daily with a meal.    . metoprolol tartrate (LOPRESSOR) 25 MG tablet   4  . NOVOLOG FLEXPEN 100 UNIT/ML FlexPen Inject 60 Units into the skin 2 (two) times daily with a  meal. 45 mL 4  . potassium chloride (K-DUR,KLOR-CON) 10 MEQ tablet Take 10 mEq by mouth 2 (two) times daily. Reported on 11/26/2015    . PROAIR HFA 108 (90 Base) MCG/ACT inhaler INL 2 PFS PO Q 6 H PRN  5   No current facility-administered medications on file prior to visit.    No Known Allergies  Family History  Problem Relation Age of Onset  . Diabetes Mother     BP 122/70 mmHg  Pulse 95  Ht 5\' 8"  (1.727 m)  Wt 325 lb (147.419 kg)  BMI 49.43 kg/m2  SpO2 93%  Review of Systems He denies hypoglycemia    Objective:   Physical Exam VITAL SIGNS:  See vs page GENERAL: no distress Pulses: dorsalis pedis intact bilat.   MSK: no deformity of the feet CV: 1+ bilat leg edema, bilat varicosities, and hyperpigmentation of the legs. Skin:  no ulcer on the feet.  normal color and temp on the feet. Neuro: sensation is intact to touch on the feet, but decreased from normal.     Assessment & Plan:  Insulin-requiring type 2 DM: he needs increased rx.    Patient is advised the following: Patient Instructions  check your blood sugar twice a day.  vary the time of day when you check, between before the 3 meals, and at bedtime.  also check if you have symptoms of your blood sugar being too high or too low.  please keep a record of the readings and bring it to your next appointment here (or you can bring the meter itself).  You can write it on any piece of paper.  please call us sooner if your blood sugar goes below 70, or if you have a lot of readings over 200.  Please continue to pursue the weight loss surgery.    Please increase lantus to 180 units each morning.    Please come back for a follow-up appointment in 6 weeks.   Romero Belling, MD

## 2016-03-12 NOTE — Patient Instructions (Addendum)
check your blood sugar twice a day.  vary the time of day when you check, between before the 3 meals, and at bedtime.  also check if you have symptoms of your blood sugar being too high or too low.  please keep a record of the readings and bring it to your next appointment here (or you can bring the meter itself).  You can write it on any piece of paper.  please call us sooner if your blood sugar goes below 70, or if you have a lot of readings over 200.  Please continue to pursue the weight loss surgery.    Please increase lantus to 180 units each morning.    Please come back for a follow-up appointment in 6 weeks.

## 2016-04-23 ENCOUNTER — Ambulatory Visit: Payer: Self-pay | Admitting: Endocrinology

## 2016-04-30 ENCOUNTER — Ambulatory Visit (INDEPENDENT_AMBULATORY_CARE_PROVIDER_SITE_OTHER): Payer: BLUE CROSS/BLUE SHIELD | Admitting: Sports Medicine

## 2016-04-30 ENCOUNTER — Encounter (INDEPENDENT_AMBULATORY_CARE_PROVIDER_SITE_OTHER): Payer: Self-pay

## 2016-04-30 ENCOUNTER — Encounter: Payer: Self-pay | Admitting: Sports Medicine

## 2016-04-30 DIAGNOSIS — E1142 Type 2 diabetes mellitus with diabetic polyneuropathy: Secondary | ICD-10-CM

## 2016-04-30 DIAGNOSIS — M79671 Pain in right foot: Secondary | ICD-10-CM

## 2016-04-30 DIAGNOSIS — M79672 Pain in left foot: Secondary | ICD-10-CM | POA: Diagnosis not present

## 2016-04-30 DIAGNOSIS — B351 Tinea unguium: Secondary | ICD-10-CM

## 2016-04-30 NOTE — Progress Notes (Signed)
Patient ID: Scott Anderson, male   DOB: 1966-07-01, 50 y.o.   MRN: [id number]   Subjective: Scott Anderson is a 50 y.o. male patient with history of type 2 diabetes who returns to office today complaining of long, painful nails  while ambulating in shoes; unable to trim. Patient states that the glucose reading this morning was around 260; reports that he has went to endocrinologist who is adjusting his medications. Patient denies any new cramping, numbness, burning or tingling in the legs.   Patient Active Problem List   Diagnosis Date Noted  . Pituitary insufficiency (HCC) 12/11/2015  . Hypogonadism male 11/29/2015  . Erectile dysfunction 11/26/2015  . Morbid obesity (HCC) 11/26/2015  . Diabetes (HCC) 11/26/2015  . HTN (hypertension) 11/26/2015  . Hypothyroidism 11/26/2015  . Anxiety state 11/26/2015  . Hypertriglyceridemia 11/26/2015   Current Outpatient Prescriptions on File Prior to Visit  Medication Sig Dispense Refill  . clomiPHENE (CLOMID) 50 MG tablet 1/4 tab daily 10 tablet 3  . diazepam (VALIUM) 5 MG tablet TK 1 T PO  TID PRN  0  . fenofibrate 160 MG tablet TK 1 T PO D  3  . gabapentin (NEURONTIN) 300 MG capsule 3 (three) times daily.   1  . Insulin Glargine (LANTUS SOLOSTAR) 100 UNIT/ML Solostar Pen Inject 180 Units into the skin every morning. And pen needles 4/day 20 pen PRN  . levothyroxine (SYNTHROID, LEVOTHROID) 50 MCG tablet Take 1 tablet (50 mcg total) by mouth daily before breakfast. 90 tablet 3  . lisinopril (PRINIVIL,ZESTRIL) 40 MG tablet TK 1 T PO D  5  . metFORMIN (GLUCOPHAGE) 1000 MG tablet Take 1,000 mg by mouth 2 (two) times daily with a meal.    . metoprolol tartrate (LOPRESSOR) 25 MG tablet   4  . NOVOLOG FLEXPEN 100 UNIT/ML FlexPen Inject 60 Units into the skin 2 (two) times daily with a meal. 45 mL 4  . potassium chloride (K-DUR,KLOR-CON) 10 MEQ tablet Take 10 mEq by mouth 2 (two) times daily. Reported on 11/26/2015    . PROAIR HFA 108 (90 Base) MCG/ACT  inhaler INL 2 PFS PO Q 6 H PRN  5   No current facility-administered medications on file prior to visit.    No Known Allergies  Recent Results (from the past 2160 hour(s))  Testosterone,Free and Total     Status: Abnormal   Collection Time: 02/11/16  4:34 PM  Result Value Ref Range   Testosterone 168 (L) 348 - 1,197 ng/dL    Comment: **Effective March 17, 2016 the reference interval for**   Testosterone, Serum Males >60 years old will be   changing to:           Adult Males:      40 - 916    Comment, Testosterone Comment     Comment: Adult male reference interval is based on a population of lean males up to 52 years old.    Testosterone, Free 10.3 7.2 - 24.0 pg/mL  Lipid panel     Status: Abnormal   Collection Time: 02/11/16  4:34 PM  Result Value Ref Range   Cholesterol 248 (H) 0 - 200 mg/dL    Comment: ATP III Classification       Desirable:  < 200 mg/dL               Borderline High:  200 - 239 mg/dL          High:  > = 409 mg/dL  Triglycerides (H) 0.0 - 149.0 mg/dL    1610.91141.0 Triglyceride is over 400; calculations on Lipids are invalid.    Comment: Normal:  <150 mg/dLBorderline High:  150 - 199 mg/dL   HDL 60.4523.40 (L) >40.98>39.00 mg/dL   Total CHOL/HDL Ratio 11     Comment:                Men          Women1/2 Average Risk     3.4          3.3Average Risk          5.0          4.42X Average Risk          9.6          7.13X Average Risk          15.0          11.0                      TSH     Status: Abnormal   Collection Time: 02/11/16  4:34 PM  Result Value Ref Range   TSH 4.54 (H) 0.35 - 4.50 uIU/mL  LDL cholesterol, direct     Status: None   Collection Time: 02/11/16  4:34 PM  Result Value Ref Range   Direct LDL 90.0 mg/dL    Comment: Optimal:  <119<100 mg/dLNear or Above Optimal:  100-129 mg/dLBorderline High:  130-159 mg/dLHigh:  160-189 mg/dLVery High:  >190 mg/dL    Objective: General: Patient is awake, alert, and oriented x 3 and in no acute distress.  Integument: Skin  is warm, dry and supple bilateral. Nails are tender, long, thickened and dystrophic with subungual debris, consistent with onychomycosis, 1-5 bilateral. At left hallux lateral nail margins there is slight incurvation and ingrowing with mild serous drainage with no surrounding signs of infection, consistent with early ingrowing nail. No open lesions or preulcerative lesions present bilateral. Remaining integument unremarkable.  Vasculature:  Dorsalis Pedis pulse 2/4 bilateral. Posterior Tibial pulse  1/4 bilateral. Capillary fill time <3 sec 1-5 bilateral. Scant hair growth to the level of the digits.Temperature gradient within normal limits. No varicosities present bilateral. No edema present bilateral.   Neurology: The patient has intact sensation measured with a 5.07/10g Semmes Weinstein Monofilament at all pedal sites bilateral . Vibratory sensation diminished bilateral with tuning fork. No Babinski sign present bilateral.   Musculoskeletal: No gross pedal deformities noted bilateral. Muscular strength 5/5 in all lower extremity muscular groups bilateral without pain or limitation on range of motion. No tenderness with calf compression bilateral.  Assessment and Plan: Problem List Items Addressed This Visit    None    Visit Diagnoses    Dermatophytosis of nail    -  Primary   Diabetic polyneuropathy associated with type 2 diabetes mellitus (HCC)       Foot pain, bilateral         -Examined patient. -Discussed and educated patient on diabetic foot care, especially with  regards to the vascular, neurological and musculoskeletal systems.  -Stressed the importance of good glycemic control and the detriment of not controlling glucose levels in relation to the foot. -Mechanically debrided all nails 1-5 bilateral using sterile nail nipper and filed with dremel without incident. Removed left hallux offending lateral nail margin and applied neoosporin and advised patient to soak and watch for  re-occurrence if happens to return to office immediately for possible need for  more aggressive nail procedure.  -Answered all patient questions -Patient to return as needed or in 3 months for at risk foot care -Patient advised to call the office if any problems or questions arise in the meantime.  [name], DPM

## 2016-05-08 ENCOUNTER — Telehealth: Payer: Self-pay | Admitting: Endocrinology

## 2016-05-08 NOTE — Telephone Encounter (Signed)
Patient stated that he has been having problem with his eye sight, and forget where he is at, since  [facility] changed his insuin  NOVOLOG and LANTUS. Please advise

## 2016-05-08 NOTE — Telephone Encounter (Signed)
See message and please advise, Thanks!  

## 2016-05-08 NOTE — Telephone Encounter (Signed)
Attempted to reach the patient to offer appointment tomorrow at 945. Pt was unavailable. Left voicemail requesting the patient to call back and schedule his appointment in the AM

## 2016-05-08 NOTE — Telephone Encounter (Signed)
Ov tomorrow 9:45 AM

## 2016-05-09 ENCOUNTER — Ambulatory Visit (INDEPENDENT_AMBULATORY_CARE_PROVIDER_SITE_OTHER): Payer: BLUE CROSS/BLUE SHIELD | Admitting: Endocrinology

## 2016-05-09 ENCOUNTER — Telehealth: Payer: Self-pay | Admitting: Endocrinology

## 2016-05-09 ENCOUNTER — Encounter: Payer: Self-pay | Admitting: Endocrinology

## 2016-05-09 DIAGNOSIS — E1142 Type 2 diabetes mellitus with diabetic polyneuropathy: Secondary | ICD-10-CM

## 2016-05-09 DIAGNOSIS — E039 Hypothyroidism, unspecified: Secondary | ICD-10-CM | POA: Diagnosis not present

## 2016-05-09 DIAGNOSIS — E781 Pure hyperglyceridemia: Secondary | ICD-10-CM

## 2016-05-09 DIAGNOSIS — Z794 Long term (current) use of insulin: Secondary | ICD-10-CM

## 2016-05-09 DIAGNOSIS — E291 Testicular hypofunction: Secondary | ICD-10-CM

## 2016-05-09 LAB — POCT GLYCOSYLATED HEMOGLOBIN (HGB A1C): HEMOGLOBIN A1C: 10.7

## 2016-05-09 LAB — TSH: TSH: 8.22 u[IU]/mL — ABNORMAL HIGH (ref 0.35–4.50)

## 2016-05-09 MED ORDER — NOVOLOG FLEXPEN 100 UNIT/ML ~~LOC~~ SOPN: 70.0000 [IU] | PEN_INJECTOR | Freq: Two times a day (BID) | SUBCUTANEOUS | 4 refills | Status: DC

## 2016-05-09 MED ORDER — INSULIN GLARGINE 100 UNIT/ML SOLOSTAR PEN: 220.0000 [IU] | PEN_INJECTOR | SUBCUTANEOUS | 99 refills | Status: DC

## 2016-05-09 MED ORDER — INSULIN GLARGINE 100 UNIT/ML SOLOSTAR PEN: 220.0000 [IU] | PEN_INJECTOR | SUBCUTANEOUS | 2 refills | Status: DC

## 2016-05-09 MED ORDER — LEVOTHYROXINE SODIUM 75 MCG PO TABS: 75.0000 ug | ORAL_TABLET | Freq: Every day | ORAL | 3 refills | Status: AC

## 2016-05-09 NOTE — Progress Notes (Signed)
Subjective:    Patient ID: Scott Anderson, male    DOB: 1966/01/01, 50 y.o.   MRN: [id number]  HPI Pt returns for f/u of diabetes mellitus: DM type: Insulin-requiring type 2 Dx'ed: 2010 Complications: painful polyneuropathy.  Therapy: insulin since dx.  DKA: never Severe hypoglycemia: never Pancreatitis: never.  Other: he takes multiple daily injections.   Interval history: no cbg record, but states cbg's are still persistently in the 200's.  There is no trend throughout the day.  He rarely misses the insulin.     Pt states few weeks of intermittent moderate blurry vision from both eyes, but no assoc diplopia.  Past Medical History:  Diagnosis Date  . Diabetes mellitus without complication (HCC)   . Hypertension   . Neuropathy (HCC)     No past surgical history on file.  Social History   Social History  . Marital status: Married    Spouse name: N/A  . Number of children: N/A  . Years of education: N/A   Occupational History  . Not on file.   Social History Main Topics  . Smoking status: Never Smoker  . Smokeless tobacco: Never Used  . Alcohol use No  . Drug use: No  . Sexual activity: Not on file   Other Topics Concern  . Not on file   Social History Narrative  . No narrative on file    Current Outpatient Prescriptions on File Prior to Visit  Medication Sig Dispense Refill  . clomiPHENE (CLOMID) 50 MG tablet 1/4 tab daily 10 tablet 3  . diazepam (VALIUM) 5 MG tablet TK 1 T PO  TID PRN  0  . fenofibrate 160 MG tablet TK 1 T PO D  3  . gabapentin (NEURONTIN) 300 MG capsule 3 (three) times daily.   1  . lisinopril (PRINIVIL,ZESTRIL) 40 MG tablet TK 1 T PO D  5  . metFORMIN (GLUCOPHAGE) 1000 MG tablet Take 1,000 mg by mouth 2 (two) times daily with a meal.    . metoprolol tartrate (LOPRESSOR) 25 MG tablet   4  . potassium chloride (K-DUR,KLOR-CON) 10 MEQ tablet Take 10 mEq by mouth 2 (two) times daily. Reported on 11/26/2015    . PROAIR HFA 108 (90 Base)  MCG/ACT inhaler INL 2 PFS PO Q 6 H PRN  5   No current facility-administered medications on file prior to visit.     No Known Allergies  Family History  Problem Relation Age of Onset  . Diabetes Mother     BP 134/88   Pulse 72   Ht 5\' 8"  (1.727 m)   Wt (!) 321 lb (145.6 kg)   BMI 48.81 kg/m   Review of Systems He denies hypoglycemia and weight change.     Objective:   Physical Exam VITAL SIGNS:  See vs page.  GENERAL: no distress.  Eyes: limited fundoscopy is normal.  VITAL SIGNS:  See vs page GENERAL: no distress Pulses: dorsalis pedis intact bilat.   MSK: no deformity of the feet CV: 1+ bilat leg edema, bilat varicosities, and hyperpigmentation of the legs. Skin:  no ulcer on the feet.  normal color and temp on the feet. Neuro: sensation is intact to touch on the feet, but decreased from normal.   A1c=10.7%    Assessment & Plan:  Insulin-requiring type 2 DM: ongoing poor control.  He may need a simpler insulin regimen, but we'll increase current rx for now. Blurry vision, uncertain etiology, better now.   Forgetfulness,  not noted today.  uncertain etiology. Obesity: persistent.

## 2016-05-09 NOTE — Progress Notes (Signed)
Vision Test: With prescription glasses. Right Eye: 20/20 Left Eye: 20/30  Ann MakiMegan Daksha Koone, LPN

## 2016-05-09 NOTE — Patient Instructions (Addendum)
check your blood sugar twice a day.  vary the time of day when you check, between before the 3 meals, and at bedtime.  also check if you have symptoms of your blood sugar being too high or too low.  please keep a record of the readings and bring it to your next appointment here (or you can bring the meter itself).  You can write it on any piece of paper.  please call us sooner if your blood sugar goes below 70, or if you have a lot of readings over 200.  Please continue to pursue the weight loss surgery.    Please increase lantus to 220 units each morning, and Increase novolog to 70 units twice a day (just before each meal).  Please come back for a follow-up appointment in 2 weeks.  blood tests are requested for you today.  We'll let you know about the results. Please see your eye doctor as soon as possible, about the blurry vision.

## 2016-05-09 NOTE — Telephone Encounter (Signed)
Pharmacy notified 220 units of Lantus daily is correct per Dr. George HughEllison's office note on 05/09/2016.

## 2016-05-09 NOTE — Telephone Encounter (Signed)
Pharmacy called and needs to verify the dosage on the  Lantus script that was sent over.

## 2016-05-10 LAB — TESTOSTERONE,FREE AND TOTAL
TESTOSTERONE: 179 ng/dL — AB (ref 264–916)
Testosterone, Free: 8.4 pg/mL (ref 7.2–24.0)

## 2016-05-12 ENCOUNTER — Telehealth: Payer: Self-pay | Admitting: Endocrinology

## 2016-05-12 NOTE — Telephone Encounter (Signed)
PT called back and said that the Pharmacy is telling him that they did not have the information needed to fill the Lantus prescription.  I informed the PT that Aundra MilletMegan gave the instructions to the pharmacy on 05/09/16. He stated that they still are telling him they do not have clear instructions.

## 2016-05-12 NOTE — Telephone Encounter (Signed)
I contacted [facility] and left a verbal clarification for Mr. Scott Anderson's Lantus prescription.

## 2016-05-19 ENCOUNTER — Ambulatory Visit: Payer: Self-pay | Admitting: Endocrinology

## 2016-05-24 ENCOUNTER — Other Ambulatory Visit: Payer: Self-pay | Admitting: Endocrinology

## 2016-05-30 ENCOUNTER — Ambulatory Visit (INDEPENDENT_AMBULATORY_CARE_PROVIDER_SITE_OTHER): Payer: BLUE CROSS/BLUE SHIELD | Admitting: Endocrinology

## 2016-05-30 ENCOUNTER — Encounter: Payer: Self-pay | Admitting: Endocrinology

## 2016-05-30 DIAGNOSIS — E291 Testicular hypofunction: Secondary | ICD-10-CM | POA: Diagnosis not present

## 2016-05-30 DIAGNOSIS — E1142 Type 2 diabetes mellitus with diabetic polyneuropathy: Secondary | ICD-10-CM

## 2016-05-30 DIAGNOSIS — E039 Hypothyroidism, unspecified: Secondary | ICD-10-CM

## 2016-05-30 DIAGNOSIS — E781 Pure hyperglyceridemia: Secondary | ICD-10-CM | POA: Diagnosis not present

## 2016-05-30 DIAGNOSIS — Z794 Long term (current) use of insulin: Secondary | ICD-10-CM

## 2016-05-30 MED ORDER — NOVOLOG FLEXPEN 100 UNIT/ML ~~LOC~~ SOPN: 90.0000 [IU] | PEN_INJECTOR | Freq: Two times a day (BID) | SUBCUTANEOUS | 4 refills | Status: DC

## 2016-05-30 MED ORDER — INSULIN GLARGINE 100 UNIT/ML SOLOSTAR PEN: 250.0000 [IU] | PEN_INJECTOR | SUBCUTANEOUS | 11 refills | Status: DC

## 2016-05-30 NOTE — Progress Notes (Signed)
   Subjective:    Patient ID: Scott Anderson, male    DOB: 05-Dec-1965, 50 y.o.   MRN: [id number]  HPI Pt returns for f/u of diabetes mellitus: DM type: Insulin-requiring type 2 Dx'ed: 2010 Complications: painful polyneuropathy.  Therapy: insulin since dx.  DKA: never Severe hypoglycemia: never Pancreatitis: never.  Other: he takes multiple daily injections.   Interval history: no cbg record, but states cbg's vary from 200-300's.  There is no trend throughout the day.  He says he seldom the insulin.  He denies hypoglycemia.   Past Medical History:  Diagnosis Date  . Diabetes mellitus without complication (HCC)   . Hypertension   . Neuropathy (HCC)     No past surgical history on file.  Social History   Social History  . Marital status: Married    Spouse name: N/A  . Number of children: N/A  . Years of education: N/A   Occupational History  . Not on file.   Social History Main Topics  . Smoking status: Never Smoker  . Smokeless tobacco: Never Used  . Alcohol use No  . Drug use: No  . Sexual activity: Not on file   Other Topics Concern  . Not on file   Social History Narrative  . No narrative on file    Current Outpatient Prescriptions on File Prior to Visit  Medication Sig Dispense Refill  . clomiPHENE (CLOMID) 50 MG tablet TAKE 1/4 TABLET BY MOUTH DAILY 10 tablet 0  . diazepam (VALIUM) 5 MG tablet TK 1 T PO  TID PRN  0  . fenofibrate 160 MG tablet TK 1 T PO D  3  . gabapentin (NEURONTIN) 300 MG capsule 3 (three) times daily.   1  . levothyroxine (SYNTHROID, LEVOTHROID) 75 MCG tablet Take 1 tablet (75 mcg total) by mouth daily. 90 tablet 3  . lisinopril (PRINIVIL,ZESTRIL) 40 MG tablet TK 1 T PO D  5  . metFORMIN (GLUCOPHAGE) 1000 MG tablet Take 1,000 mg by mouth 2 (two) times daily with a meal.    . metoprolol tartrate (LOPRESSOR) 25 MG tablet   4  . potassium chloride (K-DUR,KLOR-CON) 10 MEQ tablet Take 10 mEq by mouth 2 (two) times daily. Reported on  11/26/2015    . PROAIR HFA 108 (90 Base) MCG/ACT inhaler INL 2 PFS PO Q 6 H PRN  5   No current facility-administered medications on file prior to visit.     No Known Allergies  Family History  Problem Relation Age of Onset  . Diabetes Mother     BP (!) 162/84   Pulse 82   Ht 5\' 8"  (1.727 m)   Wt (!) 323 lb (146.5 kg)   SpO2 96%   BMI 49.11 kg/m   Review of Systems He reports polyuria.      Objective:   Physical Exam VITAL SIGNS:  See vs page.  GENERAL: no distress.   Pulses: dorsalis pedis intact bilat.   MSK: no deformity of the feet CV: 1+ bilat leg edema, bilat varicosities, and hyperpigmentation of the legs. Skin:  no ulcer on the feet.  normal color and temp on the feet.   Neuro: sensation is intact to touch on the feet, but decreased from normal.    Lab Results  Component Value Date   HGBA1C 10.7 05/09/2016      Assessment & Plan:  Insulin-requiring type 2 DM: he needs increased rx Obesity, worse.  He declines to add trulicity.

## 2016-05-30 NOTE — Patient Instructions (Addendum)
check your blood sugar twice a day.  vary the time of day when you check, between before the 3 meals, and at bedtime.  also check if you have symptoms of your blood sugar being too high or too low.  please keep a record of the readings and bring it to your next appointment here (or you can bring the meter itself).  You can write it on any piece of paper.  please call us sooner if your blood sugar goes below 70, or if you have a lot of readings over 200.  Please continue to pursue the weight loss surgery.    Please increase lantus to 250 units each morning, and Increase novolog to 90 units twice a day (just before each meal).   Please come back for a follow-up appointment in 1 month.

## 2016-06-27 ENCOUNTER — Other Ambulatory Visit: Payer: Self-pay | Admitting: Endocrinology

## 2016-06-27 NOTE — Telephone Encounter (Signed)
Patient need refill of clomiPHENE (CLOMID) 50 MG tablet  [facility] [id number] - [place] - [address] [facility] [address] [phone] (Phone) [phone] (Fax)

## 2016-07-27 NOTE — Progress Notes (Deleted)
   Subjective:    Patient ID: Scott Anderson, male    DOB: [birth year], 50 y.o.   MRN: [id number]  HPI Pt returns for f/u of diabetes mellitus: DM type: Insulin-requiring type 2 Dx'ed: 2010 Complications: painful polyneuropathy.  Therapy: insulin since dx.  DKA: never Severe hypoglycemia: never Pancreatitis: never.  Other: Scott Anderson takes multiple daily injections.   Interval history: no cbg record, but states cbg's vary from 200-300's.  There is no trend throughout the day.  Scott Anderson says Scott Anderson seldom the insulin.  Scott Anderson denies hypoglycemia.    Review of Systems     Objective:   Physical Exam VITAL SIGNS:  See vs page.  GENERAL: no distress.   Pulses: dorsalis pedis intact bilat.   MSK: no deformity of the feet CV: 1+ bilat leg edema, bilat varicosities, and hyperpigmentation of the legs. Skin:  no ulcer on the feet.  normal color and temp on the feet.   Neuro: sensation is intact to touch on the feet, but decreased from normal.        Assessment & Plan:  Insulin-requiring type 2 DM, with polyneuropathy: Scott Anderson needs increased rx Obesity, worse.  Scott Anderson declines to add trulicity.

## 2016-07-30 ENCOUNTER — Ambulatory Visit (INDEPENDENT_AMBULATORY_CARE_PROVIDER_SITE_OTHER): Payer: BLUE CROSS/BLUE SHIELD | Admitting: Sports Medicine

## 2016-07-30 ENCOUNTER — Ambulatory Visit: Payer: Self-pay | Admitting: Endocrinology

## 2016-07-30 ENCOUNTER — Encounter: Payer: Self-pay | Admitting: Sports Medicine

## 2016-07-30 DIAGNOSIS — M79672 Pain in left foot: Secondary | ICD-10-CM | POA: Diagnosis not present

## 2016-07-30 DIAGNOSIS — E1142 Type 2 diabetes mellitus with diabetic polyneuropathy: Secondary | ICD-10-CM | POA: Diagnosis not present

## 2016-07-30 DIAGNOSIS — M79671 Pain in right foot: Secondary | ICD-10-CM

## 2016-07-30 DIAGNOSIS — B351 Tinea unguium: Secondary | ICD-10-CM | POA: Diagnosis not present

## 2016-07-30 NOTE — Progress Notes (Signed)
Patient ID: Scott Anderson, male   DOB: [birth year], 50 y.o.   MRN: [id number]   Subjective: Scott Anderson is a 50 y.o. male patient with history of type 2 diabetes who returns to office today complaining of long, painful nails  while ambulating in shoes; unable to trim. Patient states that the glucose reading this morning was around 250; reports that he has went to endocrinologist who is adjusting his medications and states that it will always be this high. Patient denies any new cramping, numbness, burning or tingling in the legs.   Patient Active Problem List   Diagnosis Date Noted  . Pituitary insufficiency (HCC) 12/11/2015  . Hypogonadism male 11/29/2015  . Erectile dysfunction 11/26/2015  . Morbid obesity (HCC) 11/26/2015  . Diabetes (HCC) 11/26/2015  . HTN (hypertension) 11/26/2015  . Hypothyroidism 11/26/2015  . Anxiety state 11/26/2015  . Hypertriglyceridemia 11/26/2015   Current Outpatient Prescriptions on File Prior to Visit  Medication Sig Dispense Refill  . clomiPHENE (CLOMID) 50 MG tablet TAKE 1/4 TABLET BY MOUTH DAILY 10 tablet 0  . diazepam (VALIUM) 5 MG tablet TK 1 T PO  TID PRN  0  . fenofibrate 160 MG tablet TK 1 T PO D  3  . gabapentin (NEURONTIN) 300 MG capsule 3 (three) times daily.   1  . Insulin Glargine (LANTUS SOLOSTAR) 100 UNIT/ML Solostar Pen Inject 250 Units into the skin every morning. 30 pen 11  . levothyroxine (SYNTHROID, LEVOTHROID) 75 MCG tablet Take 1 tablet (75 mcg total) by mouth daily. 90 tablet 3  . lisinopril (PRINIVIL,ZESTRIL) 40 MG tablet TK 1 T PO D  5  . metFORMIN (GLUCOPHAGE) 1000 MG tablet Take 1,000 mg by mouth 2 (two) times daily with a meal.    . metoprolol tartrate (LOPRESSOR) 25 MG tablet   4  . NOVOLOG FLEXPEN 100 UNIT/ML FlexPen Inject 90 Units into the skin 2 (two) times daily with a meal. And pen needles 10/day 60 mL 4  . potassium chloride (K-DUR,KLOR-CON) 10 MEQ tablet Take 10 mEq by mouth 2 (two) times daily. Reported on 11/26/2015     . PROAIR HFA 108 (90 Base) MCG/ACT inhaler INL 2 PFS PO Q 6 H PRN  5   No current facility-administered medications on file prior to visit.    No Known Allergies  Recent Results (from the past 2160 hour(s))  Testosterone,Free and Total     Status: Abnormal   Collection Time: 05/09/16 10:00 AM  Result Value Ref Range   Testosterone 179 (L) 264 - 916 ng/dL    Comment: Adult male reference interval is based on a population of healthy nonobese males (BMI <30) between 119 and 52 years old. Travison, et.al. JCEM [id number],102;1161-1173. PMID: [id number].    Testosterone, Free 8.4 7.2 - 24.0 pg/mL  POCT glycosylated hemoglobin (Hb A1C)     Status: None   Collection Time: 05/09/16 11:20 AM  Result Value Ref Range   Hemoglobin A1C 10.7   TSH     Status: Abnormal   Collection Time: 05/09/16  1:42 PM  Result Value Ref Range   TSH 8.22 (H) 0.35 - 4.50 uIU/mL    Objective: General: Patient is awake, alert, and oriented x 3 and in no acute distress.  Integument: Skin is warm, dry and supple bilateral. Nails are tender, long, thickened and dystrophic with subungual debris, consistent with onychomycosis, 1-5 bilateral. At right hallux medial and lateral nail margins there is slight incurvation and ingrowing with no surrounding signs of  infection, consistent with early ingrowing nail. No open lesions or preulcerative lesions present bilateral. Remaining integument unremarkable.  Vasculature:  Dorsalis Pedis pulse 2/4 bilateral. Posterior Tibial pulse  1/4 bilateral. Capillary fill time <3 sec 1-5 bilateral. Scant hair growth to the level of the digits.Temperature gradient within normal limits. No varicosities present bilateral. No edema present bilateral.   Neurology: The patient has intact sensation measured with a 5.07/10g Semmes Weinstein Monofilament at all pedal sites bilateral . Vibratory sensation diminished bilateral with tuning fork. No Babinski sign present bilateral.   Musculoskeletal: No  gross pedal deformities noted bilateral. Muscular strength 5/5 in all lower extremity muscular groups bilateral without pain or limitation on range of motion. No tenderness with calf compression bilateral.  Assessment and Plan: Problem List Items Addressed This Visit    None    Visit Diagnoses    Dermatophytosis of nail    -  Primary   Diabetic polyneuropathy associated with type 2 diabetes mellitus (HCC)       Foot pain, bilateral         -Examined patient. -Discussed and educated patient on diabetic foot care, especially with  regards to the vascular, neurological and musculoskeletal systems.  -Stressed the importance of good glycemic control and the detriment of not controlling glucose levels in relation to the foot. -Mechanically debrided all nails 1-5 bilateral using sterile nail nipper and filed with dremel without incident. Removed right hallux offending nail margins and applied neoosporin and advised patient to soak and watch for re-occurrence if happens to return to office immediately for possible need for more aggressive nail procedure.  -Answered all patient questions -Patient to return as needed or in 3 months for at risk foot care -Patient advised to call the office if any problems or questions arise in the meantime.  Asencion Islamitorya Kairee Kozma, DPM

## 2016-07-31 ENCOUNTER — Ambulatory Visit (INDEPENDENT_AMBULATORY_CARE_PROVIDER_SITE_OTHER): Payer: BLUE CROSS/BLUE SHIELD | Admitting: Endocrinology

## 2016-07-31 ENCOUNTER — Encounter: Payer: Self-pay | Admitting: Endocrinology

## 2016-07-31 VITALS — BP 134/84 | HR 88 | Ht 68.0 in | Wt 325.0 lb

## 2016-07-31 DIAGNOSIS — Z794 Long term (current) use of insulin: Secondary | ICD-10-CM

## 2016-07-31 DIAGNOSIS — E039 Hypothyroidism, unspecified: Secondary | ICD-10-CM

## 2016-07-31 DIAGNOSIS — E291 Testicular hypofunction: Secondary | ICD-10-CM

## 2016-07-31 DIAGNOSIS — E1142 Type 2 diabetes mellitus with diabetic polyneuropathy: Secondary | ICD-10-CM

## 2016-07-31 LAB — TSH: TSH: 2.34 u[IU]/mL (ref 0.35–4.50)

## 2016-07-31 LAB — POCT GLYCOSYLATED HEMOGLOBIN (HGB A1C): Hemoglobin A1C: 11.2

## 2016-07-31 MED ORDER — SILDENAFIL CITRATE 20 MG PO TABS: ORAL_TABLET | ORAL | 11 refills | Status: AC

## 2016-07-31 MED ORDER — INSULIN GLARGINE 100 UNIT/ML SOLOSTAR PEN: 300.0000 [IU] | PEN_INJECTOR | SUBCUTANEOUS | 11 refills | Status: DC

## 2016-07-31 NOTE — Patient Instructions (Addendum)
check your blood sugar twice a day.  vary the time of day when you check, between before the 3 meals, and at bedtime.  also check if you have symptoms of your blood sugar being too high or too low.  please keep a record of the readings and bring it to your next appointment here (or you can bring the meter itself).  You can write it on any piece of paper.  please call us sooner if your blood sugar goes below 70, or if you have a lot of readings over 200.  Please continue to pursue the weight loss surgery.  The weight loss helps the testosterone, too.   blood tests are requested for you today.  We'll let you know about the results. Please increase lantus to 300 units each morning, and:  continue novolog, 90 units twice a day (just before breakfast and supper).   Here is a prescription, for viagra   Please come back for a follow-up appointment in 2 months.

## 2016-07-31 NOTE — Progress Notes (Signed)
Subjective:    Patient ID: Scott Anderson, male    DOB: Apr 06, 1966, 50 y.o.   MRN: [id number]  HPI  The state of at least three ongoing medical problems is addressed today, with interval history of each noted here: Pt returns for f/u of diabetes mellitus: DM type: Insulin-requiring type 2 Dx'ed: 2010 Complications: painful polyneuropathy.  Therapy: insulin since dx.  DKA: never Severe hypoglycemia: never Pancreatitis: never.  Other: he takes multiple daily injections.   Interval history: no cbg record, but states cbg's still vary from 200-300's.  There is no trend throughout the day.  He says he seldom the insulin.  ED persists.   Past Medical History:  Diagnosis Date  . Diabetes mellitus without complication (HCC)   . Hypertension   . Neuropathy (HCC)     No past surgical history on file.  Social History   Social History  . Marital status: Married    Spouse name: N/A  . Number of children: N/A  . Years of education: N/A   Occupational History  . Not on file.   Social History Main Topics  . Smoking status: Never Smoker  . Smokeless tobacco: Never Used  . Alcohol use No  . Drug use: No  . Sexual activity: Not on file   Other Topics Concern  . Not on file   Social History Narrative  . No narrative on file    Current Outpatient Prescriptions on File Prior to Visit  Medication Sig Dispense Refill  . clomiPHENE (CLOMID) 50 MG tablet TAKE 1/4 TABLET BY MOUTH DAILY 10 tablet 0  . diazepam (VALIUM) 5 MG tablet TK 1 T PO  TID PRN  0  . fenofibrate 160 MG tablet TK 1 T PO D  3  . gabapentin (NEURONTIN) 300 MG capsule 3 (three) times daily.   1  . levothyroxine (SYNTHROID, LEVOTHROID) 75 MCG tablet Take 1 tablet (75 mcg total) by mouth daily. 90 tablet 3  . lisinopril (PRINIVIL,ZESTRIL) 40 MG tablet TK 1 T PO D  5  . metFORMIN (GLUCOPHAGE) 1000 MG tablet Take 1,000 mg by mouth 2 (two) times daily with a meal.    . metoprolol tartrate (LOPRESSOR) 25 MG tablet   4  .  NOVOLOG FLEXPEN 100 UNIT/ML FlexPen Inject 90 Units into the skin 2 (two) times daily with a meal. And pen needles 10/day 60 mL 4  . potassium chloride (K-DUR,KLOR-CON) 10 MEQ tablet Take 10 mEq by mouth 2 (two) times daily. Reported on 11/26/2015    . PROAIR HFA 108 (90 Base) MCG/ACT inhaler INL 2 PFS PO Q 6 H PRN  5   No current facility-administered medications on file prior to visit.     No Known Allergies  Family History  Problem Relation Age of Onset  . Diabetes Mother     BP 134/84   Pulse 88   Ht 5\' 8"  (1.727 m)   Wt (!) 325 lb (147.4 kg)   SpO2 96%   BMI 49.42 kg/m     Review of Systems He denies hypoglycemia.      Objective:   Physical Exam VITAL SIGNS:  See vs page.  GENERAL: no distress.   Pulses: dorsalis pedis intact bilat.   MSK: no deformity of the feet CV: 1+ bilat leg edema, bilat varicosities, and hyperpigmentation of the legs. Skin:  no ulcer on the feet.  normal color and temp on the feet.   Neuro: sensation is intact to touch on the feet,  but decreased from normal.  Ext: There is bilateral onychomycosis of the toenails.     A1c=11.2%  Lab Results  Component Value Date   TSH 2.34 07/31/2016   Free testosterone=normal    Assessment & Plan:  Insulin-requiring type 2 DM, with polyneuropathy, ongoing poor control.   Hypogonadism: well-controlled.  Please continue the same medication.  Hypothyroidism: well-replaced.  Please continue the same medication.   Patient is advised the following:  Patient Instructions  check your blood sugar twice a day.  vary the time of day when you check, between before the 3 meals, and at bedtime.  also check if you have symptoms of your blood sugar being too high or too low.  please keep a record of the readings and bring it to your next appointment here (or you can bring the meter itself).  You can write it on any piece of paper.  please call us sooner if your blood sugar goes below 70, or if you have a lot of  readings over 200.  Please continue to pursue the weight loss surgery.  The weight loss helps the testosterone, too.   blood tests are requested for you today.  We'll let you know about the results. Please increase lantus to 300 units each morning, and:  continue novolog, 90 units twice a day (just before breakfast and supper).   Here is a prescription, for viagra   Please come back for a follow-up appointment in 2 months.

## 2016-08-01 LAB — TESTOSTERONE,FREE AND TOTAL
Testosterone, Free: 12.7 pg/mL (ref 7.2–24.0)
Testosterone: 198 ng/dL — ABNORMAL LOW (ref 264–916)

## 2016-08-05 ENCOUNTER — Other Ambulatory Visit: Payer: Self-pay | Admitting: Endocrinology

## 2016-09-30 ENCOUNTER — Ambulatory Visit (INDEPENDENT_AMBULATORY_CARE_PROVIDER_SITE_OTHER): Payer: BLUE CROSS/BLUE SHIELD | Admitting: Endocrinology

## 2016-09-30 ENCOUNTER — Encounter: Payer: Self-pay | Admitting: Endocrinology

## 2016-09-30 VITALS — BP 138/94 | HR 88 | Ht 68.0 in | Wt 321.0 lb

## 2016-09-30 DIAGNOSIS — Z794 Long term (current) use of insulin: Secondary | ICD-10-CM | POA: Diagnosis not present

## 2016-09-30 DIAGNOSIS — E1142 Type 2 diabetes mellitus with diabetic polyneuropathy: Secondary | ICD-10-CM | POA: Diagnosis not present

## 2016-09-30 LAB — POCT GLYCOSYLATED HEMOGLOBIN (HGB A1C): Hemoglobin A1C: 11

## 2016-09-30 MED ORDER — INSULIN GLARGINE 100 UNIT/ML SOLOSTAR PEN: 400.0000 [IU] | PEN_INJECTOR | SUBCUTANEOUS | 11 refills | Status: DC

## 2016-09-30 NOTE — Progress Notes (Signed)
Subjective:    Patient ID: Scott Anderson, male    DOB: Oct 25, 1965, 51 y.o.   MRN: [id number]  HPI The state of at least three ongoing medical problems is addressed today, with interval history of each noted here: Pt returns for f/u of diabetes mellitus: DM type: Insulin-requiring type 2 Dx'ed: 2010 Complications: painful polyneuropathy.  Therapy: insulin since dx.  DKA: never Severe hypoglycemia: never Pancreatitis: never.  Other: he takes multiple daily injections.   Interval history: no cbg record, but states cbg's still vary from 200-250.  There is no trend throughout the day.  He says he rarely misses the insulin.   Past Medical History:  Diagnosis Date  . Diabetes mellitus without complication (HCC)   . Hypertension   . Neuropathy (HCC)     No past surgical history on file.  Social History   Social History  . Marital status: Married    Spouse name: N/A  . Number of children: N/A  . Years of education: N/A   Occupational History  . Not on file.   Social History Main Topics  . Smoking status: Never Smoker  . Smokeless tobacco: Never Used  . Alcohol use No  . Drug use: No  . Sexual activity: Not on file   Other Topics Concern  . Not on file   Social History Narrative  . No narrative on file    Current Outpatient Prescriptions on File Prior to Visit  Medication Sig Dispense Refill  . clomiPHENE (CLOMID) 50 MG tablet TAKE 1/4 TABLET BY MOUTH DAILY 10 tablet 0  . diazepam (VALIUM) 5 MG tablet TK 1 T PO  TID PRN  0  . fenofibrate 160 MG tablet TK 1 T PO D  3  . gabapentin (NEURONTIN) 300 MG capsule 3 (three) times daily.   1  . levothyroxine (SYNTHROID, LEVOTHROID) 75 MCG tablet Take 1 tablet (75 mcg total) by mouth daily. 90 tablet 3  . lisinopril (PRINIVIL,ZESTRIL) 40 MG tablet TK 1 T PO D  5  . metFORMIN (GLUCOPHAGE) 1000 MG tablet Take 1,000 mg by mouth 2 (two) times daily with a meal.    . metoprolol tartrate (LOPRESSOR) 25 MG tablet   4  . NOVOLOG  FLEXPEN 100 UNIT/ML FlexPen Inject 90 Units into the skin 2 (two) times daily with a meal. And pen needles 10/day 60 mL 4  . potassium chloride (K-DUR,KLOR-CON) 10 MEQ tablet Take 10 mEq by mouth 2 (two) times daily. Reported on 11/26/2015    . PROAIR HFA 108 (90 Base) MCG/ACT inhaler INL 2 PFS PO Q 6 H PRN  5  . sildenafil (REVATIO) 20 MG tablet 2-5 tabs, as needed for ED symptoms 50 tablet 11   No current facility-administered medications on file prior to visit.     No Known Allergies  Family History  Problem Relation Age of Onset  . Diabetes Mother     BP (!) 138/94   Pulse 88   Ht 5\' 8"  (1.727 m)   Wt (!) 321 lb (145.6 kg)   SpO2 98%   BMI 48.81 kg/m    Review of Systems He denies hypoglycemia    Objective:   Physical Exam VITAL SIGNS:  See vs page.  GENERAL: no distress.   Pulses: dorsalis pedis intact bilat.   MSK: no deformity of the feet CV: 1+ bilat leg edema, bilat varicosities, and hyperpigmentation of the legs. Skin:  no ulcer on the feet.  normal color and temp on the  feet.   Neuro: sensation is intact to touch on the feet, but decreased from normal.  Ext: There is bilateral onychomycosis of the toenails.    A1c=11.0%    Assessment & Plan:  Insulin-requiring type 2 DM, with polyneuropathy: ongoing poor control. Morbid obesity: persistent.    Patient is advised the following: Patient Instructions  check your blood sugar twice a day.  vary the time of day when you check, between before the 3 meals, and at bedtime.  also check if you have symptoms of your blood sugar being too high or too low.  please keep a record of the readings and bring it to your next appointment here (or you can bring the meter itself).  You can write it on any piece of paper.  please call us sooner if your blood sugar goes below 70, or if you have a lot of readings over 200.   blood tests are requested for you today.  We'll let you know about the results.  Please increase lantus to 400  units each morning, and:  continue novolog, 90 units twice a day (just before breakfast and supper).   Please come back for a follow-up appointment in 2 months.     Bariatric Surgery You have so much to gain by losing weight.  You may have already tried every diet and exercise plan imaginable.  And, you may have sought advice from your family physician, too.   Sometimes, in spite of such diligent efforts, you may not be able to achieve long-term results by yourself.  In cases of severe obesity, bariatric or weight loss surgery is a proven method of achieving long-term weight control.  Our Services Our bariatric surgery programs offer our patients new hope and long-term weight-loss solution.  Since introducing our services in 2003, we have conducted more than 2,400 successful procedures.  Our program is designated as a Investment banker, corporate by the Metabolic and Bariatric Surgery Accreditation and Quality Improvement Program (MBSAQIP), a Child psychotherapist that sets rigorous patient safety and outcome standards.  Our program is also designated as a Engineer, manufacturing systems by [employer].   Our exceptional weight-loss surgery team specializes in diagnosis, treatment, follow-up care, and ongoing support for our patients with severe weight loss challenges.  We currently offer laparoscopic sleeve gastrectomy, gastric bypass, and adjustable gastric band (LAP-BAND).    Attend our Bariatrics Seminar Choosing to undergo a bariatric procedure is a big decision, and one that should not be taken lightly.  You now have two options in how you learn about weight-loss surgery - in person or online.  Our objective is to ensure you have all of the information that you need to evaluate the advantages and obligations of this life changing procedure.  Please note that you are not alone in this process, and our experienced team is ready to assist and answer all of your questions.  There are several ways  to register for a seminar (either on-line or in person): 1)  Call [phone] 2) Go on-line to [facility] and register for either type of seminar.  [email or web address]

## 2016-09-30 NOTE — Patient Instructions (Addendum)
check your blood sugar twice a day.  vary the time of day when you check, between before the 3 meals, and at bedtime.  also check if you have symptoms of your blood sugar being too high or too low.  please keep a record of the readings and bring it to your next appointment here (or you can bring the meter itself).  You can write it on any piece of paper.  please call us sooner if your blood sugar goes below 70, or if you have a lot of readings over 200.   blood tests are requested for you today.  We'll let you know about the results.  Please increase lantus to 400 units each morning, and:  continue novolog, 90 units twice a day (just before breakfast and supper).   Please come back for a follow-up appointment in 2 months.     Bariatric Surgery You have so much to gain by losing weight.  You may have already tried every diet and exercise plan imaginable.  And, you may have sought advice from your family physician, too.   Sometimes, in spite of such diligent efforts, you may not be able to achieve long-term results by yourself.  In cases of severe obesity, bariatric or weight loss surgery is a proven method of achieving long-term weight control.  Our Services Our bariatric surgery programs offer our patients new hope and long-term weight-loss solution.  Since introducing our services in 2003, we have conducted more than 2,400 successful procedures.  Our program is designated as a Investment banker, corporateComprehensive Center by the Metabolic and Bariatric Surgery Accreditation and Quality Improvement Program (MBSAQIP), a Child psychotherapistnational accrediting body that sets rigorous patient safety and outcome standards.  Our program is also designated as a Engineer, manufacturing systemsCenter of Excellence by [employer] insurance companies.   Our exceptional weight-loss surgery team specializes in diagnosis, treatment, follow-up care, and ongoing support for our patients with severe weight loss challenges.  We currently offer laparoscopic sleeve gastrectomy, gastric bypass, and  adjustable gastric band (LAP-BAND).    Attend our Bariatrics Seminar Choosing to undergo a bariatric procedure is a big decision, and one that should not be taken lightly.  You now have two options in how you learn about weight-loss surgery - in person or online.  Our objective is to ensure you have all of the information that you need to evaluate the advantages and obligations of this life changing procedure.  Please note that you are not alone in this process, and our experienced team is ready to assist and answer all of your questions.  There are several ways to register for a seminar (either on-line or in person): 1)  Call [phone] 2) Go on-line to [facility] and register for either type of seminar.  [email or web address]

## 2016-10-28 ENCOUNTER — Other Ambulatory Visit: Payer: Self-pay | Admitting: Endocrinology

## 2016-10-29 ENCOUNTER — Ambulatory Visit: Payer: BLUE CROSS/BLUE SHIELD | Admitting: Sports Medicine

## 2016-10-31 ENCOUNTER — Telehealth: Payer: Self-pay | Admitting: Endocrinology

## 2016-10-31 NOTE — Telephone Encounter (Signed)
Refill submitted on 10/29/2016.

## 2016-10-31 NOTE — Telephone Encounter (Signed)
Pt needs a refill of his Clomiphene sent to the [facility] in [place].

## 2016-11-26 ENCOUNTER — Telehealth: Payer: Self-pay | Admitting: Endocrinology

## 2016-11-26 DIAGNOSIS — Z794 Long term (current) use of insulin: Secondary | ICD-10-CM

## 2016-11-26 DIAGNOSIS — E291 Testicular hypofunction: Secondary | ICD-10-CM

## 2016-11-26 DIAGNOSIS — E781 Pure hyperglyceridemia: Secondary | ICD-10-CM

## 2016-11-26 DIAGNOSIS — E1142 Type 2 diabetes mellitus with diabetic polyneuropathy: Secondary | ICD-10-CM

## 2016-11-26 NOTE — Telephone Encounter (Signed)
Ok, i'll do PA 

## 2016-11-26 NOTE — Telephone Encounter (Signed)
Patient stated pharmacy called,stated correction needed to be made for the  Insulin Glargine (LANTUS SOLOSTAR) 100 UNIT/ML Solostar Pen 50 pen

## 2016-11-26 NOTE — Telephone Encounter (Signed)
I contacted the patient and advised via voicemail we had received the notice from the pharmacy. Patient's insurance was requring a PA due to the amount of Lantus he is currently taking. Please advise if ok to proceed with PA. (Pa placed on your desk to review). Thanks!

## 2016-11-27 NOTE — Telephone Encounter (Signed)
Were you able to review the PA I placed on your desk? Thanks!

## 2016-11-27 NOTE — Telephone Encounter (Signed)
done

## 2016-11-28 ENCOUNTER — Ambulatory Visit: Payer: Self-pay | Admitting: Endocrinology

## 2016-12-01 NOTE — Telephone Encounter (Signed)
PA submitted, waiting on response.

## 2016-12-03 ENCOUNTER — Ambulatory Visit (INDEPENDENT_AMBULATORY_CARE_PROVIDER_SITE_OTHER): Payer: BLUE CROSS/BLUE SHIELD | Admitting: Sports Medicine

## 2016-12-03 ENCOUNTER — Encounter: Payer: Self-pay | Admitting: Sports Medicine

## 2016-12-03 DIAGNOSIS — B351 Tinea unguium: Secondary | ICD-10-CM

## 2016-12-03 DIAGNOSIS — M79672 Pain in left foot: Secondary | ICD-10-CM | POA: Diagnosis not present

## 2016-12-03 DIAGNOSIS — S91209A Unspecified open wound of unspecified toe(s) with damage to nail, initial encounter: Secondary | ICD-10-CM

## 2016-12-03 DIAGNOSIS — E1142 Type 2 diabetes mellitus with diabetic polyneuropathy: Secondary | ICD-10-CM | POA: Diagnosis not present

## 2016-12-03 DIAGNOSIS — M79671 Pain in right foot: Secondary | ICD-10-CM | POA: Diagnosis not present

## 2016-12-03 NOTE — Telephone Encounter (Signed)
I need to know what is alternative.

## 2016-12-03 NOTE — Telephone Encounter (Signed)
Pa for Lantus 400 units daily was not approved under the insurance due to the high amount of insulin needed. Please advise on how to proceed.

## 2016-12-03 NOTE — Telephone Encounter (Signed)
Patient is calling on the status of PA. Please advise

## 2016-12-03 NOTE — Progress Notes (Signed)
Patient ID: COLONEL KRAUSER, male   DOB: 1966-03-04, 51 y.o.   MRN: [id number]   Subjective: Scott Anderson is a 51 y.o. male patient with history of type 2 diabetes who returns to office today complaining of long, painful nails  while ambulating in shoes; unable to trim. Patient states that the glucose reading this morning was same. Reports last night, not loose, and ripped off the left first toe nail. Patient denies any new cramping, numbness, burning or tingling in the legs.   Patient Active Problem List   Diagnosis Date Noted  . Pituitary insufficiency (HCC) 12/11/2015  . Hypogonadism male 11/29/2015  . Erectile dysfunction 11/26/2015  . Morbid obesity (HCC) 11/26/2015  . Diabetes (HCC) 11/26/2015  . HTN (hypertension) 11/26/2015  . Hypothyroidism 11/26/2015  . Anxiety state 11/26/2015  . Hypertriglyceridemia 11/26/2015   Current Outpatient Prescriptions on File Prior to Visit  Medication Sig Dispense Refill  . clomiPHENE (CLOMID) 50 MG tablet TAKE 1/4 TABLET BY MOUTH DAILY 10 tablet 0  . diazepam (VALIUM) 5 MG tablet TK 1 T PO  TID PRN  0  . fenofibrate 160 MG tablet TK 1 T PO D  3  . gabapentin (NEURONTIN) 300 MG capsule 3 (three) times daily.   1  . Insulin Glargine (LANTUS SOLOSTAR) 100 UNIT/ML Solostar Pen Inject 400 Units into the skin every morning. 50 pen 11  . levothyroxine (SYNTHROID, LEVOTHROID) 75 MCG tablet Take 1 tablet (75 mcg total) by mouth daily. 90 tablet 3  . lisinopril (PRINIVIL,ZESTRIL) 40 MG tablet TK 1 T PO D  5  . metFORMIN (GLUCOPHAGE) 1000 MG tablet Take 1,000 mg by mouth 2 (two) times daily with a meal.    . metoprolol tartrate (LOPRESSOR) 25 MG tablet   4  . NOVOLOG FLEXPEN 100 UNIT/ML FlexPen Inject 90 Units into the skin 2 (two) times daily with a meal. And pen needles 10/day 60 mL 4  . potassium chloride (K-DUR,KLOR-CON) 10 MEQ tablet Take 10 mEq by mouth 2 (two) times daily. Reported on 11/26/2015    . PROAIR HFA 108 (90 Base) MCG/ACT inhaler INL 2 PFS  PO Q 6 H PRN  5  . sildenafil (REVATIO) 20 MG tablet 2-5 tabs, as needed for ED symptoms 50 tablet 11   No current facility-administered medications on file prior to visit.    No Known Allergies  Recent Results (from the past 2160 hour(s))  POCT glycosylated hemoglobin (Hb A1C)     Status: None   Collection Time: 09/30/16  4:31 PM  Result Value Ref Range   Hemoglobin A1C 11.0     Objective: General: Patient is awake, alert, and oriented x 3 and in no acute distress.  Integument: Skin is warm, dry and supple bilateral. Nails are tender, long, thickened and dystrophic with subungual debris, consistent with onychomycosis, 1-5 bilateralExcept at left first toenail where there is no nail present currently with dry blood at nailbed. History of traumatic avulsion by knocking nail off when stubbing the toe last night. No open lesions or preulcerative lesions present bilateral. Remaining integument unremarkable.  Vasculature:  Dorsalis Pedis pulse 2/4 bilateral. Posterior Tibial pulse  1/4 bilateral. Capillary fill time <3 sec 1-5 bilateral. Scant hair growth to the level of the digits.Temperature gradient within normal limits. No varicosities present bilateral. No edema present bilateral.   Neurology: The patient has intact sensation measured with a 5.07/10g Semmes Weinstein Monofilament at all pedal sites bilateral . Vibratory sensation diminished bilateral with tuning fork. No  Babinski sign present bilateral.   Musculoskeletal: No gross pedal deformities noted bilateral. Muscular strength 5/5 in all lower extremity muscular groups bilateral without pain or limitation on range of motion. No tenderness with calf compression bilateral.  Assessment and Plan: Problem List Items Addressed This Visit    None    Visit Diagnoses    Dermatophytosis of nail    -  Primary   Diabetic polyneuropathy associated with type 2 diabetes mellitus (HCC)       Foot pain, bilateral       Traumatic avulsion of  nail plate of toe, initial encounter       Left first toe     -Examined patient. -Discussed and educated patient on diabetic foot care, especially with  regards to the vascular, neurological and musculoskeletal systems.  -Stressed the importance of good glycemic control and the detriment of not controlling glucose levels in relation to the foot. -Mechanically debrided all nails 1-5 on right and 2 through 5 on left using sterile nail nipper and filed with dremel without incident.  -Cleansed left first toe nail bed with saline solution and applied antibiotic cream and Band-Aid. Advised patient to soak daily using Epsom salt and cover during the day with antibiotic cream and Band-Aid. May leave open to air at night. Advised patient to allow toe to slowly to heal and to keep his dog away from licking the toe to prevent infection.  -Answered all patient questions -Patient to return as needed or in 3 months for at risk foot care -Patient advised to call the office if any problems or questions arise in the meantime.  [name], DPM

## 2016-12-04 ENCOUNTER — Other Ambulatory Visit: Payer: Self-pay

## 2016-12-04 MED ORDER — INSULIN GLARGINE 100 UNIT/ML SOLOSTAR PEN: 300.0000 [IU] | PEN_INJECTOR | SUBCUTANEOUS | 11 refills | Status: DC

## 2016-12-04 MED ORDER — NOVOLOG FLEXPEN 100 UNIT/ML ~~LOC~~ SOPN: 110.0000 [IU] | PEN_INJECTOR | Freq: Two times a day (BID) | SUBCUTANEOUS | 4 refills | Status: DC

## 2016-12-04 NOTE — Telephone Encounter (Signed)
Ok, please reduce the lantus to 300 units qd, and increase novolog to 110 units bid.  I'll see you next time.

## 2016-12-04 NOTE — Telephone Encounter (Signed)
Lantus is the preferred medication but will not be covered at 400 units per day.

## 2016-12-04 NOTE — Telephone Encounter (Signed)
Submitted

## 2016-12-04 NOTE — Telephone Encounter (Signed)
OK, next I ned to know how much they will cover.

## 2016-12-04 NOTE — Telephone Encounter (Signed)
300 units is the max insurance will cover for.

## 2016-12-09 ENCOUNTER — Ambulatory Visit (INDEPENDENT_AMBULATORY_CARE_PROVIDER_SITE_OTHER): Payer: BLUE CROSS/BLUE SHIELD | Admitting: Endocrinology

## 2016-12-09 ENCOUNTER — Encounter: Payer: Self-pay | Admitting: Endocrinology

## 2016-12-09 VITALS — BP 136/84 | HR 94 | Ht 68.0 in | Wt 311.0 lb

## 2016-12-09 DIAGNOSIS — Z794 Long term (current) use of insulin: Secondary | ICD-10-CM | POA: Diagnosis not present

## 2016-12-09 DIAGNOSIS — E1142 Type 2 diabetes mellitus with diabetic polyneuropathy: Secondary | ICD-10-CM

## 2016-12-09 LAB — POCT GLYCOSYLATED HEMOGLOBIN (HGB A1C): Hemoglobin A1C: 11.8

## 2016-12-09 MED ORDER — DULAGLUTIDE 0.75 MG/0.5ML ~~LOC~~ SOAJ: 0.7500 mg | SUBCUTANEOUS | 11 refills | Status: DC

## 2016-12-09 MED ORDER — INSULIN GLARGINE 100 UNIT/ML SOLOSTAR PEN: 300.0000 [IU] | PEN_INJECTOR | SUBCUTANEOUS | 11 refills | Status: DC

## 2016-12-09 NOTE — Patient Instructions (Addendum)
check your blood sugar twice a day.  vary the time of day when you check, between before the 3 meals, and at bedtime.  also check if you have symptoms of your blood sugar being too high or too low.  please keep a record of the readings and bring it to your next appointment here (or you can bring the meter itself).  You can write it on any piece of paper.  please call us sooner if your blood sugar goes below 70, or if you have a lot of readings over 200.   Please take lantus, 300 units each morning (I have sent a prescription to your pharmacy), and:  continue novolog, 90 units twice a day (just before breakfast and supper), and: I have sent a prescription to your pharmacy, to add "trulicity." Please come back for a follow-up appointment in 6 weeks.

## 2016-12-09 NOTE — Progress Notes (Signed)
Subjective:    Patient ID: Scott Anderson, male    DOB: 22-Apr-1966, 51 y.o.   MRN: [id number]  HPI Pt returns for f/u of diabetes mellitus: DM type: Insulin-requiring type 2 Dx'ed: 2010 Complications: painful polyneuropathy.  Therapy: insulin since dx.  DKA: never Severe hypoglycemia: never.   Pancreatitis: never.  Other: he takes multiple daily injections.   Interval history: no cbg record, but states cbg's still vary from 300-350.  He says he rarely misses the insulin. Ins capped lantus coverage at 300 units qd.  He has been off lantus x 1 month, due to ins declining.    Past Medical History:  Diagnosis Date  . Diabetes mellitus without complication (HCC)   . Hypertension   . Neuropathy (HCC)     No past surgical history on file.  Social History   Social History  . Marital status: Married    Spouse name: N/A  . Number of children: N/A  . Years of education: N/A   Occupational History  . Not on file.   Social History Main Topics  . Smoking status: Never Smoker  . Smokeless tobacco: Never Used  . Alcohol use No  . Drug use: No  . Sexual activity: Not on file   Other Topics Concern  . Not on file   Social History Narrative  . No narrative on file    Current Outpatient Prescriptions on File Prior to Visit  Medication Sig Dispense Refill  . clomiPHENE (CLOMID) 50 MG tablet TAKE 1/4 TABLET BY MOUTH DAILY 10 tablet 0  . diazepam (VALIUM) 5 MG tablet TK 1 T PO  TID PRN  0  . fenofibrate 160 MG tablet TK 1 T PO D  3  . gabapentin (NEURONTIN) 300 MG capsule 3 (three) times daily.   1  . levothyroxine (SYNTHROID, LEVOTHROID) 75 MCG tablet Take 1 tablet (75 mcg total) by mouth daily. 90 tablet 3  . lisinopril (PRINIVIL,ZESTRIL) 40 MG tablet TK 1 T PO D  5  . metFORMIN (GLUCOPHAGE) 1000 MG tablet Take 1,000 mg by mouth 2 (two) times daily with a meal.    . metoprolol tartrate (LOPRESSOR) 25 MG tablet   4  . NOVOLOG FLEXPEN 100 UNIT/ML FlexPen Inject 110 Units into  the skin 2 (two) times daily with a meal. One hundred ten units BID, and pen needles 10/day 75 mL 4  . potassium chloride (K-DUR,KLOR-CON) 10 MEQ tablet Take 10 mEq by mouth 2 (two) times daily. Reported on 11/26/2015    . PROAIR HFA 108 (90 Base) MCG/ACT inhaler INL 2 PFS PO Q 6 H PRN  5  . sildenafil (REVATIO) 20 MG tablet 2-5 tabs, as needed for ED symptoms 50 tablet 11   No current facility-administered medications on file prior to visit.     No Known Allergies  Family History  Problem Relation Age of Onset  . Diabetes Mother     BP 136/84   Pulse 94   Ht  (1.727 m)   Wt (!) 311 lb (141.1 kg)   SpO2 97%   BMI 47.29 kg/m   Review of Systems He has lost 10 lbs.  He denies hypoglycemia.     Objective:   Physical Exam VITAL SIGNS:  See vs page.  GENERAL: no distress.   Pulses: dorsalis pedis intact bilat.   MSK: no deformity of the feet.  CV: 1+ bilat leg edema, bilat varicosities, and hyperpigmentation of the legs.  Skin:  no ulcer on  the feet.  normal color and temp on the feet.   Neuro: sensation is intact to touch on the feet, but decreased from normal.  Ext: There is bilateral onychomycosis of the toenails. Left great toenail is absent 9pt says x 1 week--he sees podiatry).     Lab Results  Component Value Date   HGBA1C 11.8 12/09/2016       Assessment & Plan:  Insulin-requiring type 2 DM, with polyneuropathy: he needs increased rx   Patient Instructions  check your blood sugar twice a day.  vary the time of day when you check, between before the 3 meals, and at bedtime.  also check if you have symptoms of your blood sugar being too high or too low.  please keep a record of the readings and bring it to your next appointment here (or you can bring the meter itself).  You can write it on any piece of paper.  please call us sooner if your blood sugar goes below 70, or if you have a lot of readings over 200.   Please take lantus, 300 units each morning (I have  sent a prescription to your pharmacy), and:  continue novolog, 90 units twice a day (just before breakfast and supper), and: I have sent a prescription to your pharmacy, to add "trulicity." Please come back for a follow-up appointment in 6 weeks.

## 2016-12-18 ENCOUNTER — Telehealth: Payer: Self-pay | Admitting: Endocrinology

## 2016-12-18 MED ORDER — LIRAGLUTIDE 18 MG/3ML ~~LOC~~ SOPN: 1.8000 mg | PEN_INJECTOR | Freq: Every day | SUBCUTANEOUS | 11 refills | Status: DC

## 2016-12-18 NOTE — Telephone Encounter (Signed)
I contacted the patient and advised of message via voicemail. Requested a call back if the patient would like to discuss further.  

## 2016-12-18 NOTE — Telephone Encounter (Signed)
please call patient: Ins denies trulicity.  They want you to take victoza instead.  I have sent a prescription to your pharmacy.  This medication, you start low, and increase it every few days, to avoid nausea.

## 2016-12-27 ENCOUNTER — Other Ambulatory Visit: Payer: Self-pay | Admitting: Endocrinology

## 2017-01-20 ENCOUNTER — Encounter: Payer: Self-pay | Admitting: Endocrinology

## 2017-01-20 ENCOUNTER — Ambulatory Visit (INDEPENDENT_AMBULATORY_CARE_PROVIDER_SITE_OTHER): Payer: BLUE CROSS/BLUE SHIELD | Admitting: Endocrinology

## 2017-01-20 VITALS — BP 130/78 | HR 87 | Ht 69.0 in | Wt 313.0 lb

## 2017-01-20 DIAGNOSIS — Z794 Long term (current) use of insulin: Secondary | ICD-10-CM

## 2017-01-20 DIAGNOSIS — E1142 Type 2 diabetes mellitus with diabetic polyneuropathy: Secondary | ICD-10-CM | POA: Diagnosis not present

## 2017-01-20 NOTE — Progress Notes (Signed)
Subjective:    Patient ID: Scott Anderson, male    DOB: 01-Jul-1966, 51 y.o.   MRN: [id number]  HPI Pt returns for f/u of diabetes mellitus: DM type: Insulin-requiring type 2 Dx'ed: 2010 Complications: painful polyneuropathy.  Therapy: insulin since dx, and trulicity.  DKA: never Severe hypoglycemia: never.   Pancreatitis: never.  Other: he takes multiple daily injections;   Ins capped lantus coverage at 300 units qd.  Interval history: no cbg record, but states cbg's are still in the 300's.  He says he rarely misses the insulin.  He says the insulin leaking from the injection site.   Past Medical History:  Diagnosis Date  . Diabetes mellitus without complication (HCC)   . Hypertension   . Neuropathy     No past surgical history on file.  Social History   Social History  . Marital status: Married    Spouse name: N/A  . Number of children: N/A  . Years of education: N/A   Occupational History  . Not on file.   Social History Main Topics  . Smoking status: Never Smoker  . Smokeless tobacco: Never Used  . Alcohol use No  . Drug use: No  . Sexual activity: Not on file   Other Topics Concern  . Not on file   Social History Narrative  . No narrative on file    Current Outpatient Prescriptions on File Prior to Visit  Medication Sig Dispense Refill  . clomiPHENE (CLOMID) 50 MG tablet TAKE 1/4 TABLET BY MOUTH DAILY 10 tablet 0  . diazepam (VALIUM) 5 MG tablet TK 1 T PO  TID PRN  0  . fenofibrate 160 MG tablet TK 1 T PO D  3  . gabapentin (NEURONTIN) 300 MG capsule 3 (three) times daily.   1  . Insulin Glargine (LANTUS SOLOSTAR) 100 UNIT/ML Solostar Pen Inject 300 Units into the skin every morning. 40 pen 11  . levothyroxine (SYNTHROID, LEVOTHROID) 75 MCG tablet Take 1 tablet (75 mcg total) by mouth daily. 90 tablet 3  . liraglutide (VICTOZA) 18 MG/3ML SOPN Inject 0.3 mLs (1.8 mg total) into the skin daily. 9 mL 11  . lisinopril (PRINIVIL,ZESTRIL) 40 MG tablet TK 1 T  PO D  5  . metFORMIN (GLUCOPHAGE) 1000 MG tablet Take 1,000 mg by mouth 2 (two) times daily with a meal.    . metoprolol tartrate (LOPRESSOR) 25 MG tablet   4  . NOVOLOG FLEXPEN 100 UNIT/ML FlexPen Inject 110 Units into the skin 2 (two) times daily with a meal. One hundred ten units BID, and pen needles 10/day 75 mL 4  . potassium chloride (K-DUR,KLOR-CON) 10 MEQ tablet Take 10 mEq by mouth 2 (two) times daily. Reported on 11/26/2015    . PROAIR HFA 108 (90 Base) MCG/ACT inhaler INL 2 PFS PO Q 6 H PRN  5  . sildenafil (REVATIO) 20 MG tablet 2-5 tabs, as needed for ED symptoms 50 tablet 11   No current facility-administered medications on file prior to visit.     No Known Allergies  Family History  Problem Relation Age of Onset  . Diabetes Mother     BP 130/78   Pulse 87   Ht 5\' 9"  (1.753 m)   Wt (!) 313 lb (142 kg)   SpO2 92%   BMI 46.22 kg/m   Review of Systems He denies hypoglycemia.      Objective:   Physical Exam VITAL SIGNS:  See vs page.  GENERAL: no  distress.   Pulses: dorsalis pedis intact bilat.   MSK: no deformity of the feet.  CV: 1+ bilat leg edema, bilat varicosities, and hyperpigmentation of the legs.  Skin:  no ulcer on the feet.  normal color and temp on the feet.   Neuro: sensation is intact to touch on the feet, but decreased from normal.  Ext: There is bilateral onychomycosis of the toenails. Left great toenail is absent (pt says x 1 week--he sees podiatry).       Assessment & Plan:  Insulin-requiring type 2 ongoing poor control.   Injection site probs, new.     Patient Instructions  check your blood sugar twice a day.  vary the time of day when you check, between before the 3 meals, and at bedtime.  also check if you have symptoms of your blood sugar being too high or too low.  please keep a record of the readings and bring it to your next appointment here (or you can bring the meter itself).  You can write it on any piece of paper.  please call us  sooner if your blood sugar goes below 70, or if you have a lot of readings over 200.   Please see Bonita Quin, to review insulin injections.  Please continue the same medications.  Please call us next week, to tell us how the blood sugar is doing.    Please come back for a follow-up appointment in 2 months.

## 2017-01-20 NOTE — Patient Instructions (Addendum)
check your blood sugar twice a day.  vary the time of day when you check, between before the 3 meals, and at bedtime.  also check if you have symptoms of your blood sugar being too high or too low.  please keep a record of the readings and bring it to your next appointment here (or you can bring the meter itself).  You can write it on any piece of paper.  please call us sooner if your blood sugar goes below 70, or if you have a lot of readings over 200.   Please see Bonita QuinLinda, to review insulin injections.  Please continue the same medications.  Please call us next week, to tell us how the blood sugar is doing.    Please come back for a follow-up appointment in 2 months.

## 2017-02-04 ENCOUNTER — Other Ambulatory Visit: Payer: Self-pay | Admitting: Endocrinology

## 2017-03-11 ENCOUNTER — Ambulatory Visit: Payer: BLUE CROSS/BLUE SHIELD | Admitting: Sports Medicine

## 2017-03-13 ENCOUNTER — Ambulatory Visit: Payer: BLUE CROSS/BLUE SHIELD | Admitting: Sports Medicine

## 2017-03-18 ENCOUNTER — Ambulatory Visit (INDEPENDENT_AMBULATORY_CARE_PROVIDER_SITE_OTHER): Payer: BLUE CROSS/BLUE SHIELD | Admitting: Sports Medicine

## 2017-03-18 DIAGNOSIS — E1142 Type 2 diabetes mellitus with diabetic polyneuropathy: Secondary | ICD-10-CM

## 2017-03-18 DIAGNOSIS — M79671 Pain in right foot: Secondary | ICD-10-CM

## 2017-03-18 DIAGNOSIS — B351 Tinea unguium: Secondary | ICD-10-CM | POA: Diagnosis not present

## 2017-03-18 DIAGNOSIS — M79672 Pain in left foot: Secondary | ICD-10-CM

## 2017-03-18 NOTE — Progress Notes (Addendum)
Patient ID: Scott Anderson, male   DOB: 01-May-1966, 51 y.o.   MRN: [id number]   Subjective: Scott Anderson is a 51 y.o. male patient with history of type 2 diabetes who returns to office today complaining of long, painful nails  while ambulating in shoes; unable to trim. Patient states that the glucose reading this morning was elevated and since last visit His doctor has added on Victoza. Reports loose right first toenail. Patient denies any new cramping, numbness, burning or tingling in the legs.   Patient Active Problem List   Diagnosis Date Noted  . Pituitary insufficiency (HCC) 12/11/2015  . Hypogonadism male 11/29/2015  . Erectile dysfunction 11/26/2015  . Morbid obesity (HCC) 11/26/2015  . Diabetes (HCC) 11/26/2015  . HTN (hypertension) 11/26/2015  . Hypothyroidism 11/26/2015  . Anxiety state 11/26/2015  . Hypertriglyceridemia 11/26/2015   Current Outpatient Prescriptions on File Prior to Visit  Medication Sig Dispense Refill  . clomiPHENE (CLOMID) 50 MG tablet TAKE 1/4 TABLET BY MOUTH DAILY 10 tablet 0  . diazepam (VALIUM) 5 MG tablet TK 1 T PO  TID PRN  0  . fenofibrate 160 MG tablet TK 1 T PO D  3  . gabapentin (NEURONTIN) 300 MG capsule 3 (three) times daily.   1  . Insulin Glargine (LANTUS SOLOSTAR) 100 UNIT/ML Solostar Pen Inject 300 Units into the skin every morning. 40 pen 11  . levothyroxine (SYNTHROID, LEVOTHROID) 75 MCG tablet Take 1 tablet (75 mcg total) by mouth daily. 90 tablet 3  . liraglutide (VICTOZA) 18 MG/3ML SOPN Inject 0.3 mLs (1.8 mg total) into the skin daily. 9 mL 11  . lisinopril (PRINIVIL,ZESTRIL) 40 MG tablet TK 1 T PO D  5  . metFORMIN (GLUCOPHAGE) 1000 MG tablet Take 1,000 mg by mouth 2 (two) times daily with a meal.    . metoprolol tartrate (LOPRESSOR) 25 MG tablet   4  . NOVOLOG FLEXPEN 100 UNIT/ML FlexPen Inject 110 Units into the skin 2 (two) times daily with a meal. One hundred ten units BID, and pen needles 10/day 75 mL 4  . potassium chloride  (K-DUR,KLOR-CON) 10 MEQ tablet Take 10 mEq by mouth 2 (two) times daily. Reported on 11/26/2015    . PROAIR HFA 108 (90 Base) MCG/ACT inhaler INL 2 PFS PO Q 6 H PRN  5  . sildenafil (REVATIO) 20 MG tablet 2-5 tabs, as needed for ED symptoms 50 tablet 11   No current facility-administered medications on file prior to visit.    No Known Allergies  No results found for this or any previous visit (from the past 2160 hour(s)).  Objective: General: Patient is awake, alert, and oriented x 3 and in no acute distress.  Integument: Skin is warm, dry and supple bilateral. Nails are tender, long, thickened and dystrophic with subungual debris, consistent with onychomycosis, 1-5 Except at left first toenail where there is no toenail from previous traumatic avulsion and there is an occult lysis of the right first toenail from Patient bumping and flaking nail with no signs of infection. No open lesions or preulcerative lesions present bilateral. Remaining integument unremarkable.  Vasculature:  Dorsalis Pedis pulse 2/4 bilateral. Posterior Tibial pulse  1/4 bilateral. Capillary fill time <3 sec 1-5 bilateral. Scant hair growth to the level of the digits.Temperature gradient within normal limits. No varicosities present bilateral. No edema present bilateral.   Neurology: The patient has intact sensation measured with a 5.07/10g Semmes Weinstein Monofilament at all pedal sites bilateral . Vibratory sensation  diminished bilateral with tuning fork. No Babinski sign present bilateral.   Musculoskeletal: No gross pedal deformities noted bilateral. Muscular strength 5/5 in all lower extremity muscular groups bilateral without pain or limitation on range of motion. No tenderness with calf compression bilateral.  Assessment and Plan: Problem List Items Addressed This Visit    None    Visit Diagnoses    Dermatophytosis of nail    -  Primary   Diabetic polyneuropathy associated with type 2 diabetes mellitus (HCC)        Foot pain, bilateral         -Examined patient. -Discussed and educated patient on diabetic foot care, especially with  regards to the vascular, neurological and musculoskeletal systems.  -Stressed the importance of good glycemic control and the detriment of not controlling glucose levels in relation to the foot. -Mechanically debrided all nails 1-5 on right and 2 through 5 on left using sterile nail nipper and filed with dremel without incident.  -Cleansed right first toe nail bed with saline solution and applied antibiotic cream and Band-Aid. Advised patient to soak daily using Epsom salt and cover during the day with antibiotic cream and Band-Aid. May leave open to air at night. Advised patient to allow toe to slowly to heal -Answered all patient questions -Patient to return as needed or in 3 months for at risk foot care -Patient advised to call the office if any problems or questions arise in the meantime.  [name], DPM

## 2017-03-24 ENCOUNTER — Encounter: Payer: BLUE CROSS/BLUE SHIELD | Attending: Endocrinology | Admitting: Nutrition

## 2017-03-24 ENCOUNTER — Ambulatory Visit (INDEPENDENT_AMBULATORY_CARE_PROVIDER_SITE_OTHER): Payer: BLUE CROSS/BLUE SHIELD | Admitting: Endocrinology

## 2017-03-24 DIAGNOSIS — Z713 Dietary counseling and surveillance: Secondary | ICD-10-CM | POA: Insufficient documentation

## 2017-03-24 DIAGNOSIS — Z794 Long term (current) use of insulin: Secondary | ICD-10-CM | POA: Diagnosis not present

## 2017-03-24 DIAGNOSIS — E1165 Type 2 diabetes mellitus with hyperglycemia: Secondary | ICD-10-CM

## 2017-03-24 DIAGNOSIS — E1142 Type 2 diabetes mellitus with diabetic polyneuropathy: Secondary | ICD-10-CM | POA: Insufficient documentation

## 2017-03-24 LAB — POCT GLYCOSYLATED HEMOGLOBIN (HGB A1C): Hemoglobin A1C: 11.4

## 2017-03-24 MED ORDER — INSULIN GLARGINE 300 UNIT/ML ~~LOC~~ SOPN: 300.0000 [IU] | PEN_INJECTOR | SUBCUTANEOUS | 11 refills | Status: DC

## 2017-03-24 NOTE — Progress Notes (Signed)
Subjective:    Patient ID: Scott Anderson, male    DOB: [birth year], 51 y.o.   MRN: [id number]  HPI Pt returns for f/u of diabetes mellitus: DM type: Insulin-requiring type 2 Dx'ed: 2010 Complications: painful polyneuropathy.  Therapy: insulin since dx, and trulicity.  DKA: never.  Severe hypoglycemia: never.   Pancreatitis: never.   Other: he takes multiple daily injections;   Ins capped lantus coverage at 300 units qd.  Interval history: no cbg record, but states cbg's are still in the 300's.  He says he rarely misses the insulin, but he says the insulin leaking from the injection site.   Past Medical History:  Diagnosis Date  . Diabetes mellitus without complication (HCC)   . Hypertension   . Neuropathy     No past surgical history on file.  Social History   Social History  . Marital status: Married    Spouse name: N/A  . Number of children: N/A  . Years of education: N/A   Occupational History  . Not on file.   Social History Main Topics  . Smoking status: Never Smoker  . Smokeless tobacco: Never Used  . Alcohol use No  . Drug use: No  . Sexual activity: Not on file   Other Topics Concern  . Not on file   Social History Narrative  . No narrative on file    Current Outpatient Prescriptions on File Prior to Visit  Medication Sig Dispense Refill  . clomiPHENE (CLOMID) 50 MG tablet TAKE 1/4 TABLET BY MOUTH DAILY 10 tablet 0  . diazepam (VALIUM) 5 MG tablet TK 1 T PO  TID PRN  0  . fenofibrate 160 MG tablet TK 1 T PO D  3  . gabapentin (NEURONTIN) 300 MG capsule 3 (three) times daily.   1  . levothyroxine (SYNTHROID, LEVOTHROID) 75 MCG tablet Take 1 tablet (75 mcg total) by mouth daily. 90 tablet 3  . liraglutide (VICTOZA) 18 MG/3ML SOPN Inject 0.3 mLs (1.8 mg total) into the skin daily. 9 mL 11  . lisinopril (PRINIVIL,ZESTRIL) 40 MG tablet TK 1 T PO D  5  . metFORMIN (GLUCOPHAGE) 1000 MG tablet Take 1,000 mg by mouth 2 (two) times daily with a meal.    .  metoprolol tartrate (LOPRESSOR) 25 MG tablet   4  . NOVOLOG FLEXPEN 100 UNIT/ML FlexPen Inject 110 Units into the skin 2 (two) times daily with a meal. One hundred ten units BID, and pen needles 10/day 75 mL 4  . potassium chloride (K-DUR,KLOR-CON) 10 MEQ tablet Take 10 mEq by mouth 2 (two) times daily. Reported on 11/26/2015    . PROAIR HFA 108 (90 Base) MCG/ACT inhaler INL 2 PFS PO Q 6 H PRN  5  . sildenafil (REVATIO) 20 MG tablet 2-5 tabs, as needed for ED symptoms 50 tablet 11   No current facility-administered medications on file prior to visit.     No Known Allergies  Family History  Problem Relation Age of Onset  . Diabetes Mother     There were no vitals taken for this visit.  Review of Systems He says he has nausea since on victoza.      Objective:   Physical Exam VITAL SIGNS:  See vs page.  GENERAL: no distress.  Pulses: foot pulses are intact bilaterally.   MSK: no deformity of the feet or ankles.  CV: 1+ bilar edema of the legs, and bilat vv;s Skin:  no ulcer on the feet or  ankles.  normal temp on the feet and ankles.  There is hyperpigmentation os the legs Neuro: sensation is intact to touch on the feet and ankles, but decreased from normal.  Ext: There is bilateral onychomycosis of the toenails.    Lab Results  Component Value Date   HGBA1C 11.4 03/24/2017       Assessment & Plan:  Type 2 DM: poor control, poss due to insulin leakage.   Nausea, new, prob due to victoza.   Patient Instructions  check your blood sugar twice a day.  vary the time of day when you check, between before the 3 meals, and at bedtime.  also check if you have symptoms of your blood sugar being too high or too low.  please keep a record of the readings and bring it to your next appointment here (or you can bring the meter itself).  You can write it on any piece of paper.  please call us sooner if your blood sugar goes below 70, or if you have a lot of readings over 200.   Try reducing  the victoza to 1.2 mg daily, and:  Change the lantus to toujeo, 300 units each morning.  I'll do the prior authorization if necessary.  Please continue the same novolog. Please call or message [place] next week, to tell us how the blood sugar is doing Please come back for a follow-up appointment in 2 months.

## 2017-03-24 NOTE — Patient Instructions (Addendum)
check your blood sugar twice a day.  vary the time of day when you check, between before the 3 meals, and at bedtime.  also check if you have symptoms of your blood sugar being too high or too low.  please keep a record of the readings and bring it to your next appointment here (or you can bring the meter itself).  You can write it on any piece of paper.  please call us sooner if your blood sugar goes below 70, or if you have a lot of readings over 200.   Try reducing the victoza to 1.2 mg daily, and:  Change the lantus to toujeo, 300 units each morning.  I'll do the prior authorization if necessary.  Please continue the same novolog.  Please call or message us next week, to tell us how the blood sugar is doing.   Please come back for a follow-up appointment in 2 months.

## 2017-03-25 ENCOUNTER — Other Ambulatory Visit: Payer: Self-pay | Admitting: Endocrinology

## 2017-03-26 ENCOUNTER — Other Ambulatory Visit: Payer: Self-pay

## 2017-03-26 MED ORDER — CLOMIPHENE CITRATE 50 MG PO TABS: 12.5000 mg | ORAL_TABLET | Freq: Every day | ORAL | 0 refills | Status: AC

## 2017-03-28 NOTE — Progress Notes (Signed)
Patient reports that he is not getting "half of his insulin dose", because "most of the insulin leaks out, after giving it.  He is doing 150u of Lantus bid, with 60u at one time.  He says the insulin leaks out, and he is not getting half the dose.  Discussed with Dr. Everardo AllEllison the possiblity of giving him Toujeo with have the fluid.  He is seeing this patient after me, and will discuss this with him.  Says blood sugars are "high" because he is not getting the insulin.  Denies sweet drinks, and cereal, and juice.  Says is drinking water or diet drinks.  Meals are still high in fat, but says Trulicity is helping him to reduce his between meal eating and not eating as large portions as he use to eat.   Stressed the need for exercise, but says pain from neuropathy is great, and suggested the need to get the blood sugars down, to help reduce this pain some.  He agreed to try some walking when pain is less.

## 2017-03-31 ENCOUNTER — Telehealth: Payer: Self-pay | Admitting: Endocrinology

## 2017-03-31 NOTE — Telephone Encounter (Signed)
Patient is having extreme pain in his joints. Patient wondering if this is due to the medications he may be on,   Insulin Glargine (TOUJEO SOLOSTAR) 300 UNIT/ML SOPN   liraglutide (VICTOZA) 18 MG/3ML SOPN or the NOVOLOG FLEXPEN 100 UNIT/ML FlexPen.  Call patient to advise as soon as possible.

## 2017-03-31 NOTE — Telephone Encounter (Signed)
It would be very unusual for the insulins or the Victoza to cause this.Marland Kitchen..Marland Kitchen

## 2017-04-01 NOTE — Telephone Encounter (Signed)
Patient is aware 

## 2017-04-01 NOTE — Telephone Encounter (Signed)
Gave patient the advice and he asked if he should continue taking the medications- I advised him that Dr. Elvera LennoxGherghe does not feel it could be coming from the insulin or Victoza and he should consider calling his PCP- patient also stated he has swelling as well and wants to know if this could be coming from the medication in question please advise

## 2017-04-01 NOTE — Telephone Encounter (Signed)
A little swelling may happen with insulin, but I would check with PCP to make sure everything else is ruled out.

## 2017-04-01 NOTE — Patient Instructions (Signed)
Test blood sugars at least twice day. Try to do some form of exercise for a total of 30 min. 4-5 days/wk.

## 2017-04-02 DIAGNOSIS — Z6841 Body Mass Index (BMI) 40.0 and over, adult: Secondary | ICD-10-CM | POA: Diagnosis not present

## 2017-04-02 DIAGNOSIS — Z79899 Other long term (current) drug therapy: Secondary | ICD-10-CM | POA: Diagnosis not present

## 2017-04-02 DIAGNOSIS — M25531 Pain in right wrist: Secondary | ICD-10-CM | POA: Diagnosis not present

## 2017-04-13 DIAGNOSIS — M25531 Pain in right wrist: Secondary | ICD-10-CM | POA: Diagnosis not present

## 2017-04-15 DIAGNOSIS — M25531 Pain in right wrist: Secondary | ICD-10-CM | POA: Diagnosis not present

## 2017-04-21 DIAGNOSIS — M25531 Pain in right wrist: Secondary | ICD-10-CM | POA: Diagnosis not present

## 2017-04-22 DIAGNOSIS — S63591A Other specified sprain of right wrist, initial encounter: Secondary | ICD-10-CM | POA: Diagnosis not present

## 2017-05-26 DIAGNOSIS — E1140 Type 2 diabetes mellitus with diabetic neuropathy, unspecified: Secondary | ICD-10-CM | POA: Diagnosis not present

## 2017-05-26 DIAGNOSIS — E039 Hypothyroidism, unspecified: Secondary | ICD-10-CM | POA: Diagnosis not present

## 2017-05-26 DIAGNOSIS — F329 Major depressive disorder, single episode, unspecified: Secondary | ICD-10-CM | POA: Diagnosis not present

## 2017-05-26 DIAGNOSIS — E1151 Type 2 diabetes mellitus with diabetic peripheral angiopathy without gangrene: Secondary | ICD-10-CM | POA: Diagnosis not present

## 2017-05-26 DIAGNOSIS — E785 Hyperlipidemia, unspecified: Secondary | ICD-10-CM | POA: Diagnosis not present

## 2017-05-26 DIAGNOSIS — I10 Essential (primary) hypertension: Secondary | ICD-10-CM | POA: Diagnosis not present

## 2017-05-26 DIAGNOSIS — F411 Generalized anxiety disorder: Secondary | ICD-10-CM | POA: Diagnosis not present

## 2017-05-26 DIAGNOSIS — Z125 Encounter for screening for malignant neoplasm of prostate: Secondary | ICD-10-CM | POA: Diagnosis not present

## 2017-05-26 DIAGNOSIS — Z1389 Encounter for screening for other disorder: Secondary | ICD-10-CM | POA: Diagnosis not present

## 2017-05-26 DIAGNOSIS — Z6841 Body Mass Index (BMI) 40.0 and over, adult: Secondary | ICD-10-CM | POA: Diagnosis not present

## 2017-05-26 DIAGNOSIS — I509 Heart failure, unspecified: Secondary | ICD-10-CM | POA: Diagnosis not present

## 2017-05-27 ENCOUNTER — Ambulatory Visit: Payer: Self-pay | Admitting: Endocrinology

## 2017-06-02 ENCOUNTER — Ambulatory Visit (INDEPENDENT_AMBULATORY_CARE_PROVIDER_SITE_OTHER): Payer: PPO | Admitting: Endocrinology

## 2017-06-02 ENCOUNTER — Encounter: Payer: Self-pay | Admitting: Endocrinology

## 2017-06-02 VITALS — BP 132/92 | HR 97 | Wt 312.4 lb

## 2017-06-02 DIAGNOSIS — Z9119 Patient's noncompliance with other medical treatment and regimen: Secondary | ICD-10-CM

## 2017-06-02 DIAGNOSIS — Z794 Long term (current) use of insulin: Secondary | ICD-10-CM | POA: Diagnosis not present

## 2017-06-02 DIAGNOSIS — E1142 Type 2 diabetes mellitus with diabetic polyneuropathy: Secondary | ICD-10-CM | POA: Diagnosis not present

## 2017-06-02 DIAGNOSIS — Z9114 Patient's other noncompliance with medication regimen: Secondary | ICD-10-CM | POA: Diagnosis not present

## 2017-06-02 MED ORDER — INSULIN GLARGINE 300 UNIT/ML ~~LOC~~ SOPN: 500.0000 [IU] | PEN_INJECTOR | SUBCUTANEOUS | 11 refills | Status: AC

## 2017-06-02 NOTE — Patient Instructions (Addendum)
Please stop taking the novolog and the victoza, to save on copays, and: Increase the toujeo to 500 units each morning.  I have sent a prescription to your pharmacy.  Insurance tells [place] today they will pay for this quantity.  check your blood sugar once a day.  vary the time of day when you check, between before the 3 meals, and at bedtime.  also check if you have symptoms of your blood sugar being too high or too low.  please keep a record of the readings and bring it to your next appointment here (or you can bring the meter itself).  You can write it on any piece of paper.  please call us sooner if your blood sugar goes below 70, or if you have a lot of readings over 200. Please come back for a follow-up appointment in 1 month.    Scott Anderson

## 2017-06-02 NOTE — Progress Notes (Signed)
Subjective:    Patient ID: Scott Anderson, male    DOB: January 31, 1966, 51 y.o.   MRN: [id number]  HPI Pt returns for f/u of diabetes mellitus: DM type: Insulin-requiring type 2 Dx'ed: 2010 Complications: painful polyneuropathy.  Therapy: insulin since dx, and trulicity.  DKA: never.  Severe hypoglycemia: never.   Pancreatitis: never.   Other: he takes multiple daily injections;   Ins has capped lantus coverage at 300 units qd.  Interval history: Pt says he misses the insulin and/or victoza, because he does not have the copay ($45 for each).  no cbg record, but states cbg's are 200-350.   Past Medical History:  Diagnosis Date  . Diabetes mellitus without complication (HCC)   . Hypertension   . Neuropathy     No past surgical history on file.  Social History   Social History  . Marital status: Married    Spouse name: N/A  . Number of children: N/A  . Years of education: N/A   Occupational History  . Not on file.   Social History Main Topics  . Smoking status: Never Smoker  . Smokeless tobacco: Never Used  . Alcohol use No  . Drug use: No  . Sexual activity: Not on file   Other Topics Concern  . Not on file   Social History Narrative  . No narrative on file    Current Outpatient Prescriptions on File Prior to Visit  Medication Sig Dispense Refill  . clomiPHENE (CLOMID) 50 MG tablet Take 0.5 tablets (25 mg total) by mouth daily. 10 tablet 0  . diazepam (VALIUM) 5 MG tablet TK 1 T PO  TID PRN  0  . fenofibrate 160 MG tablet TK 1 T PO D  3  . gabapentin (NEURONTIN) 300 MG capsule 3 (three) times daily.   1  . levothyroxine (SYNTHROID, LEVOTHROID) 75 MCG tablet Take 1 tablet (75 mcg total) by mouth daily. 90 tablet 3  . lisinopril (PRINIVIL,ZESTRIL) 40 MG tablet TK 1 T PO D  5  . metFORMIN (GLUCOPHAGE) 1000 MG tablet Take 1,000 mg by mouth 2 (two) times daily with a meal.    . metoprolol tartrate (LOPRESSOR) 25 MG tablet   4  . potassium chloride (K-DUR,KLOR-CON) 10  MEQ tablet Take 10 mEq by mouth 2 (two) times daily. Reported on 11/26/2015    . PROAIR HFA 108 (90 Base) MCG/ACT inhaler INL 2 PFS PO Q 6 H PRN  5  . sildenafil (REVATIO) 20 MG tablet 2-5 tabs, as needed for ED symptoms 50 tablet 11   No current facility-administered medications on file prior to visit.     No Known Allergies  Family History  Problem Relation Age of Onset  . Diabetes Mother     BP (!) 132/92   Pulse 97   Wt (!) 312 lb 6.4 oz (141.7 kg)   SpO2 99%   BMI 46.13 kg/m   Review of Systems He denies hypoglycemia    Objective:   Physical Exam VITAL SIGNS:  See vs page.  GENERAL: no distress.  Pulses: foot pulses are intact bilaterally.   MSK: no deformity of the feet or ankles.  CV: 1+ bilar edema of the legs, and bilat vv;s Skin:  no ulcer on the feet or ankles.  normal temp on the feet and ankles.  There is hyperpigmentation os the legs Neuro: sensation is intact to touch on the feet and ankles, but decreased from normal.  Ext: There is bilateral onychomycosis of  the toenails.    outside test results are reviewed: A1c=12.0%    Assessment & Plan:  Insulin-requiring type 2 DM, with polyneuropathy: worse.   Noncompliance with cbg recording and insulin: he needs a simpler regimen.  Our office staff has called the ins co.  They said there will be no quantity limit.    Patient Instructions  Please stop taking the novolog and the victoza, to save on copays, and: Increase the toujeo to 500 units each morning.  I have sent a prescription to your pharmacy.  Insurance tells [place] today they will pay for this quantity.  check your blood sugar once a day.  vary the time of day when you check, between before the 3 meals, and at bedtime.  also check if you have symptoms of your blood sugar being too high or too low.  please keep a record of the readings and bring it to your next appointment here (or you can bring the meter itself).  You can write it on any piece of paper.   please call us sooner if your blood sugar goes below 70, or if you have a lot of readings over 200. Please come back for a follow-up appointment in 1 month.    Marland Kitchen

## 2017-06-03 ENCOUNTER — Telehealth: Payer: Self-pay | Admitting: *Deleted

## 2017-06-03 DIAGNOSIS — S63591D Other specified sprain of right wrist, subsequent encounter: Secondary | ICD-10-CM | POA: Diagnosis not present

## 2017-06-03 NOTE — Telephone Encounter (Signed)
Patient called and states that he seen Dr. Everardo All yesterday and he prescribed Toujeo.  Patient is having insurance issues with that medication. They are wanting him to pay a lot out of pocket. Patient  states the nurse called his insurance carrier while he was at the office yesterday to see if the medication was covered. Patient is requesting a call back. His call back number is [phone]. Please advise. Thank you

## 2017-06-04 ENCOUNTER — Ambulatory Visit: Payer: Self-pay | Admitting: Endocrinology

## 2017-06-04 NOTE — Telephone Encounter (Signed)
Please review

## 2017-06-04 NOTE — Telephone Encounter (Signed)
Patient is requesting an update on the note below. Call patient as soon as possible so he can begin taking his medication by the end of today. Regardless of the the status, call to update.

## 2017-06-04 NOTE — Telephone Encounter (Signed)
Called patient's insurance company & he is in the donut hole even though medication is covered by insurance. Donut hole is $5000. Patient was wondering an alternative since he only has a few pens left. He asked for samples of myabe even Novolog?

## 2017-06-05 ENCOUNTER — Other Ambulatory Visit: Payer: Self-pay

## 2017-06-05 MED ORDER — "INSULIN SYRINGE-NEEDLE U-100 29G X 1/2"" 2 ML MISC": 4 refills | Status: DC

## 2017-06-05 NOTE — Telephone Encounter (Signed)
We are working on the patient assistance form, but this will take time.  Please buy some NPH insulin from [facility], until this can be done.

## 2017-06-05 NOTE — Telephone Encounter (Signed)
Called patient & advised him take 400 units of the NPH insulin that you can but OTC at [facility] until we can get patient assistance foms done. He also stated that he would come in to fill out forms so that we can fax them because that will speed up the process instead of waiting for them to be mailed. I will send in prescription for syringes for NPH.

## 2017-06-08 ENCOUNTER — Telehealth: Payer: Self-pay | Admitting: Endocrinology

## 2017-06-08 NOTE — Telephone Encounter (Signed)
Patient is calling on the status of a form that was filled out to help him get his insulin Toujeo. Please advise

## 2017-06-10 ENCOUNTER — Telehealth: Payer: Self-pay | Admitting: Endocrinology

## 2017-06-10 NOTE — Telephone Encounter (Signed)
Patient called re: having problems with prescription. Please call patient [phone] to discuss

## 2017-06-11 NOTE — Telephone Encounter (Signed)
Left message for pt to call back  °

## 2017-06-17 ENCOUNTER — Ambulatory Visit (INDEPENDENT_AMBULATORY_CARE_PROVIDER_SITE_OTHER): Payer: PPO | Admitting: Sports Medicine

## 2017-06-17 ENCOUNTER — Encounter: Payer: Self-pay | Admitting: Sports Medicine

## 2017-06-17 DIAGNOSIS — M79671 Pain in right foot: Secondary | ICD-10-CM

## 2017-06-17 DIAGNOSIS — E1142 Type 2 diabetes mellitus with diabetic polyneuropathy: Secondary | ICD-10-CM

## 2017-06-17 DIAGNOSIS — M79672 Pain in left foot: Secondary | ICD-10-CM

## 2017-06-17 DIAGNOSIS — B351 Tinea unguium: Secondary | ICD-10-CM

## 2017-06-17 NOTE — Progress Notes (Signed)
Patient ID: GERRIT RAFALSKI, male   DOB: [birth year], 51 y.o.   MRN: [id number]   Subjective: Scott Anderson is a 51 y.o. male patient with history of type 2 diabetes who returns to office today complaining of long, painful nails  while ambulating in shoes; unable to trim. Patient states that last night he stubbed the right 2nd tonail and knocked it off and applied antibiotic cream. Reports his glucose reading this morning was not recorded last A1c 12. Denies any other pedal complaints.  Patient Active Problem List   Diagnosis Date Noted  . Pituitary insufficiency (HCC) 12/11/2015  . Hypogonadism male 11/29/2015  . Erectile dysfunction 11/26/2015  . Morbid obesity (HCC) 11/26/2015  . Diabetes (HCC) 11/26/2015  . HTN (hypertension) 11/26/2015  . Hypothyroidism 11/26/2015  . Anxiety state 11/26/2015  . Hypertriglyceridemia 11/26/2015   Current Outpatient Prescriptions on File Prior to Visit  Medication Sig Dispense Refill  . clomiPHENE (CLOMID) 50 MG tablet Take 0.5 tablets (25 mg total) by mouth daily. 10 tablet 0  . diazepam (VALIUM) 5 MG tablet TK 1 T PO  TID PRN  0  . fenofibrate 160 MG tablet TK 1 T PO D  3  . gabapentin (NEURONTIN) 300 MG capsule 3 (three) times daily.   1  . Insulin Glargine (TOUJEO SOLOSTAR) 300 UNIT/ML SOPN Inject 500 Units into the skin every morning. And pen needles 2/day 21 pen 11  . Insulin Syringe-Needle U-100 (B-D INS SYRINGE 2CC/29GX1/2") 29G X 1/2" 2 ML MISC Used for daily insulin injections 2x. 100 each 4  . levothyroxine (SYNTHROID, LEVOTHROID) 75 MCG tablet Take 1 tablet (75 mcg total) by mouth daily. 90 tablet 3  . lisinopril (PRINIVIL,ZESTRIL) 40 MG tablet TK 1 T PO D  5  . metFORMIN (GLUCOPHAGE) 1000 MG tablet Take 1,000 mg by mouth 2 (two) times daily with a meal.    . metoprolol tartrate (LOPRESSOR) 25 MG tablet   4  . potassium chloride (K-DUR,KLOR-CON) 10 MEQ tablet Take 10 mEq by mouth 2 (two) times daily. Reported on 11/26/2015    . PROAIR HFA 108  (90 Base) MCG/ACT inhaler INL 2 PFS PO Q 6 H PRN  5  . sildenafil (REVATIO) 20 MG tablet 2-5 tabs, as needed for ED symptoms 50 tablet 11   No current facility-administered medications on file prior to visit.    No Known Allergies  Recent Results (from the past 2160 hour(s))  POCT HgB A1C     Status: None   Collection Time: 03/24/17  3:12 PM  Result Value Ref Range   Hemoglobin A1C 11.4     Objective: General: Patient is awake, alert, and oriented x 3 and in no acute distress.  Integument: Skin is warm, dry and supple bilateral. Nails are tender, long, thickened and dystrophic with subungual debris, consistent with onychomycosis, Except at right 2nd toenail where there is no toenail from previous traumatic avulsion with no signs of infection. No open lesions or preulcerative lesions present bilateral. Remaining integument unremarkable.  Vasculature:  Dorsalis Pedis pulse 2/4 bilateral. Posterior Tibial pulse  1/4 bilateral. Capillary fill time <3 sec 1-5 bilateral. Scant hair growth to the level of the digits.Temperature gradient within normal limits. No varicosities present bilateral. No edema present bilateral.   Neurology: The patient has intact sensation measured with a 5.07/10g Semmes Weinstein Monofilament at all pedal sites bilateral . Vibratory sensation diminished bilateral with tuning fork. No Babinski sign present bilateral.   Musculoskeletal: No gross pedal deformities noted  bilateral. Muscular strength 5/5 in all lower extremity muscular groups bilateral without pain or limitation on range of motion. No tenderness with calf compression bilateral.  Assessment and Plan: Problem List Items Addressed This Visit    None    Visit Diagnoses    Dermatophytosis of nail    -  Primary   Diabetic polyneuropathy associated with type 2 diabetes mellitus (HCC)       Foot pain, bilateral         -Examined patient. -Discussed and educated patient on diabetic foot care, especially with   regards to the vascular, neurological and musculoskeletal systems.  -Stressed the importance of good glycemic control and the detriment of not controlling glucose levels in relation to the foot. -Mechanically debrided all nails 1 through 5 bilatera except right 2nd toenail using sterile nail nipper and filed with dremel without incident.  -Continue with antibiotic cream to right 2nd toe nail bed until healed  -Answered all patient questions -Patient to return as needed or in 3 months for at risk foot care -Patient advised to call the office if any problems or questions arise in the meantime.  Scott Anderson, DPM

## 2017-06-18 NOTE — Telephone Encounter (Signed)
Called patient and I am sending him in the mail the patient assistance forms for Toujeo. He also stated that he was working with his insurance company to try & get things approved. Patient is concerned that since his PCP will no longer be seeing him he needs a new one. He was wondering if there was anyone you knew of that would see him as PCP?

## 2017-06-18 NOTE — Telephone Encounter (Signed)
Call [place], and ask for physician referral service.

## 2017-06-19 ENCOUNTER — Other Ambulatory Visit: Payer: Self-pay | Admitting: Pharmacist

## 2017-06-19 NOTE — Patient Outreach (Signed)
[facility]) Care Management  06/19/2017  Lezlie LyeJames G Crespi 1966-02-11 [id number]  [facility] for patient reports cost concerns with insulin.   Successful phone outreach to patient, HIPAA details verified, purpose of call explained, and he agreed to call.   Patient reports he was previously on insurance via health insurance exchange and switched to [facility] in 04/2017.    Discussed based on notes in chart from Dr Everardo AllEllison office, appears he may be working through patient assistance paperwork---he confirms form is being mailed to him for [employer] patient assistance.   Discussed with patient Social Security Extra Help---he reports he thinks income will exceed requirement.  Discussed [employer]i patient assistance requires Part D beneficiaries spend at least 5% of annual household income on prescriptions during the calendar year.  He reports he doesn't think he has spent this yet.   Of note, patient does report he has VA benefits---he is not sure if he has medical benefits with [employer].  He [place]    Plan:  Patient was encouraged to contact the VA regarding his benefits---if he has VA medical benefits manufacturer patient assistance programs may exclude him.   Will follow-up with patient's insurance to verify out-of-pocket prescription spend.   Will attempt to reach patient via phone in next 7-14 days.   Tommye StandardKevin Deb Loudin, PharmD, [facility] Clinical Pharmacist [facility] [phone]

## 2017-06-22 ENCOUNTER — Other Ambulatory Visit: Payer: Self-pay | Admitting: Pharmacist

## 2017-06-22 NOTE — Patient Outreach (Signed)
[facility]) Care Management  06/22/2017  Lezlie LyeJames G Pelaez August 16, 1966 [id number]  Unsuccessful phone outreach attempts to patient's home phone and cell phone numbers in chart.  HIPAA compliant message left requesting return call.    Patient returned call.  Discussed with patient per his Part D plan he has spent $136.19 out-of-pocket on prescriptions which he doesn't feel is corrected.    Patient was counseled he should get year to date out-of-pocket print out of his prescription cost from his local pharmacy.  He was counseled [employer] patient assistance application will evaluate his income and prescription with application once he submits it.   If patient is not able to get manufacturer assistance with Sanofi, another option could be [employer], but he may need to request a financial hardship appeal if he has not spent required out-of-pocket.  [employer] also does not make a concentrated glargine insulin.   Discussed with patient coverage gap will still exist for Part D in 2019 when medication costs reach threshold set by Medicare.  He reports he has not contacted [employer] to see if he is eligible for medical benefits and he was encouraged to [place].   Plan:  Will make outreach call to patient next week to see if he determined his prescription spend from his pharmacy.   Tommye StandardKevin Velora Horstman, PharmD, [facility] Clinical Pharmacist [facility] [phone]

## 2017-06-24 NOTE — Telephone Encounter (Signed)
Called patient & let him know about service office.

## 2017-07-03 ENCOUNTER — Other Ambulatory Visit: Payer: Self-pay | Admitting: Pharmacist

## 2017-07-03 NOTE — Patient Outreach (Signed)
[facility]) Care Management  07/03/2017  Scott Anderson [birth year] [id number]  Successful phone outreach to patient to follow-up his patient assistance needs.   Patient was to obtain his out-of-pocket spend from his pharmacy.  He reports he got it but is unable to locate at time of call.  Patient requested a call back tomorrow, offered a call back next week given tomorrow is Saturday.   Plan:  Will outreach patient next week as discussed.    Tommye StandardKevin Margrett Kalb, PharmD, [facility] CBCACP Clinical Pharmacist [facility] [phone]

## 2017-07-06 ENCOUNTER — Encounter: Payer: Self-pay | Admitting: Endocrinology

## 2017-07-06 ENCOUNTER — Telehealth: Payer: Self-pay | Admitting: Endocrinology

## 2017-07-06 ENCOUNTER — Ambulatory Visit: Payer: PPO | Admitting: Endocrinology

## 2017-07-06 VITALS — BP 132/82 | HR 90 | Wt 311.6 lb

## 2017-07-06 DIAGNOSIS — E1142 Type 2 diabetes mellitus with diabetic polyneuropathy: Secondary | ICD-10-CM | POA: Diagnosis not present

## 2017-07-06 DIAGNOSIS — Z794 Long term (current) use of insulin: Secondary | ICD-10-CM | POA: Diagnosis not present

## 2017-07-06 LAB — POCT GLYCOSYLATED HEMOGLOBIN (HGB A1C): HEMOGLOBIN A1C: 11.4

## 2017-07-06 NOTE — Patient Instructions (Addendum)
Our office has contacted [employer] care management, to see what they can do for you.   check your blood sugar once a day.  vary the time of day when you check, between before the 3 meals, and at bedtime.  also check if you have symptoms of your blood sugar being too high or too low.  please keep a record of the readings and bring it to your next appointment here (or you can bring the meter itself).  You can write it on any piece of paper.  please call us sooner if your blood sugar goes below 70, or if you have a lot of readings over 200.  Please come back for a follow-up appointment in 3 months.     .Marland Kitchen

## 2017-07-06 NOTE — Telephone Encounter (Signed)
please call [facility] [employer].  Pt says he cannot afford any insulin, and was turned down for pt assistance.  He has appt with [facility] soon.

## 2017-07-06 NOTE — Progress Notes (Signed)
Subjective:    Patient ID: Scott Anderson, male    DOB: 1966-03-17, 51 y.o.   MRN: [id number]  HPI Pt returns for f/u of diabetes mellitus: DM type: Insulin-requiring type 2 Dx'ed: 2010.   Complications: painful polyneuropathy.  Therapy: insulin since dx, and trulicity.  DKA: never.  Severe hypoglycemia: never.   Pancreatitis: never.   Other: he takes multiple daily injections;   Ins has capped lantus coverage at 300 units qd.  Interval history: Pt says he does not take insulin, because he is in the donut hole.  He also says he does not qualify for pt assistance.  no cbg record, but states cbg's are 250-300.  Pt says he was dismissed by PCP.   Past Medical History:  Diagnosis Date  . Diabetes mellitus without complication (HCC)   . Hypertension   . Neuropathy     History reviewed. No pertinent surgical history.  Social History   Socioeconomic History  . Marital status: Married    Spouse name: Not on file  . Number of children: Not on file  . Years of education: Not on file  . Highest education level: Not on file  Social Needs  . Financial resource strain: Not on file  . Food insecurity - worry: Not on file  . Food insecurity - inability: Not on file  . Transportation needs - medical: Not on file  . Transportation needs - non-medical: Not on file  Occupational History  . Not on file  Tobacco Use  . Smoking status: Never Smoker  . Smokeless tobacco: Never Used  Substance and Sexual Activity  . Alcohol use: No  . Drug use: No  . Sexual activity: Not on file  Other Topics Concern  . Not on file  Social History Narrative  . Not on file    Current Outpatient Medications on File Prior to Visit  Medication Sig Dispense Refill  . clomiPHENE (CLOMID) 50 MG tablet Take 0.5 tablets (25 mg total) by mouth daily. 10 tablet 0  . diazepam (VALIUM) 5 MG tablet TK 1 T PO  TID PRN  0  . fenofibrate 160 MG tablet TK 1 T PO D  3  . gabapentin (NEURONTIN) 300 MG capsule 3  (three) times daily.   1  . Insulin Glargine (TOUJEO SOLOSTAR) 300 UNIT/ML SOPN Inject 500 Units into the skin every morning. And pen needles 2/day 21 pen 11  . Insulin Syringe-Needle U-100 (B-D INS SYRINGE 2CC/29GX1/2") 29G X 1/2" 2 ML MISC Used for daily insulin injections 2x. 100 each 4  . levothyroxine (SYNTHROID, LEVOTHROID) 75 MCG tablet Take 1 tablet (75 mcg total) by mouth daily. 90 tablet 3  . lisinopril (PRINIVIL,ZESTRIL) 40 MG tablet TK 1 T PO D  5  . metFORMIN (GLUCOPHAGE) 1000 MG tablet Take 1,000 mg by mouth 2 (two) times daily with a meal.    . metoprolol tartrate (LOPRESSOR) 25 MG tablet   4  . potassium chloride (K-DUR,KLOR-CON) 10 MEQ tablet Take 10 mEq by mouth 2 (two) times daily. Reported on 11/26/2015    . PROAIR HFA 108 (90 Base) MCG/ACT inhaler INL 2 PFS PO Q 6 H PRN  5  . sildenafil (REVATIO) 20 MG tablet 2-5 tabs, as needed for ED symptoms 50 tablet 11   No current facility-administered medications on file prior to visit.     No Known Allergies  Family History  Problem Relation Age of Onset  . Diabetes Mother     BP 132/82 (  BP Location: Right Wrist, Patient Position: Sitting, Cuff Size: Normal)   Pulse 90   Wt (!) 311 lb 9.6 oz (141.3 kg)   SpO2 97%   BMI 46.02 kg/m    Review of Systems He denies hypoglycemia.      Objective:   Physical Exam VITAL SIGNS:  See vs page.  GENERAL: no distress.  Pulses: foot pulses are intact bilaterally.   MSK: no deformity of the feet or ankles.  CV: 1+ bilar edema of the legs, and bilat vv;s Skin:  no ulcer on the feet or ankles.  normal temp on the feet and ankles.  There is hyperpigmentation os the legs Neuro: sensation is intact to touch on the feet and ankles, but decreased from normal.  Ext: There is bilateral onychomycosis of the toenails.    Lab Results  Component Value Date   HGBA1C 11.4 07/06/2017      Assessment & Plan:  Insulin-requiring type 2 DM: very poor glycemic control Noncompliance with cbg  recording and insulin.  Pt says this is due to cost.    Patient Instructions  Our office has contacted health team advantage care management, to see what they can do for you.   check your blood sugar once a day.  vary the time of day when you check, between before the 3 meals, and at bedtime.  also check if you have symptoms of your blood sugar being too high or too low.  please keep a record of the readings and bring it to your next appointment here (or you can bring the meter itself).  You can write it on any piece of paper.  please call us sooner if your blood sugar goes below 70, or if you have a lot of readings over 200.  Please come back for a follow-up appointment in 3 months.     Marland Kitchen

## 2017-07-06 NOTE — Telephone Encounter (Signed)
I called [employer] & they closed from 12-1pm. I was going to leave VM but the box was full. I have the number [phone] & will call them back later after lunch.

## 2017-07-07 NOTE — Telephone Encounter (Signed)
Once again I have called [employer] & I was transferred to the care management team [phone]. I got the VM box of a [employer]ally Hammond & it was full, so I was unable to leave a message. I will continue to try & call back time permitting.

## 2017-07-08 ENCOUNTER — Other Ambulatory Visit: Payer: Self-pay | Admitting: Pharmacist

## 2017-07-08 NOTE — Patient Outreach (Signed)
[facility]) Care Management  07/08/2017  Scott Anderson 01-Feb-1966 [id number]  Follow-up call to patient regarding patient assistance.   HIPAA details verified.   Patient reports he has spent >$1,000 out-of-pocket on prescriptions, he is not sure if he met out-of-pocket prescription spend requirements yet for [employer] patient assistance.  Patient reports he doesn't know if he received application that was mailed to him from endocrinologist office per 06/18/17 notes in chart.    He reports he has not contacted Nurse, children's Department to follow-up if he has medical benefits, he reports he has an appointment with "VA rep this month."    Patient was again encouraged to contact [place].  He was encouraged to look through his mail to see if he received [employer]i application from his endocrinologist office.   If he has VA medical benefits, manufacturer patient assistance may be very limited.    Plan:  Out reach call to patient next week to follow-up if he contacted VA regarding potential benefits.   Karrie Meres, PharmD, [place] [phone]

## 2017-07-15 ENCOUNTER — Other Ambulatory Visit: Payer: Self-pay | Admitting: Pharmacist

## 2017-07-15 NOTE — Patient Outreach (Signed)
[facility]) Care Management  07/15/2017  Scott Anderson 11-05-1965 [id number]  Successful phone outreach to patient, HIPAA details verified.  Patient reports he did not attempt to contact his Nurse, children's rep.  He reports he has appointment with his VA rep this week.    He reports he did not find [employer] patient assistance application.    Patient assistance options may be limited if patient has not met required out-of-pocket prescription spend for the year or has VA benefits.   Given quantity of insulin prescribed, will message Dr Loanne Drilling to see if he feels U-500 insulin may be an option.  It does appear to be non-formulary on [employer] and as such may require a prior authorization.   Unable to determine cost at this time.    Plan:  Will contact Dr Loanne Drilling and follow-up with patient accordingly.   Karrie Meres, PharmD, [place] [phone]

## 2017-07-22 ENCOUNTER — Other Ambulatory Visit: Payer: Self-pay | Admitting: Pharmacist

## 2017-07-22 NOTE — Patient Outreach (Signed)
[facility]) Care Management  07/22/2017  Scott Anderson Jan 08, 1966 [id number]  Successful phone follow-up with patient, HIPAA details verified.   Patient reports he saw his Museum/gallery curator last week and reports he needs [facility] next week regarding potential [facility] benefits.    Discussed Dr Loanne Drilling was messaged and prefers not to use U-500 insulin at this time due to multiple daily injections.     Patient assistance options remain limited as patient may not have met out-of-pocket spend requirements for manufacturer patient assistance program and he is not sure if he has [place] medical benefits.   He was strongly encouraged to follow-up with the [place].    Patient requests a call in about 2 weeks.    Plan:  Will outreach patient in next 2 weeks per his request---anticipate potential case closure due to limited options for patient assistance.   Will route this message to Dr Loanne Drilling.  Karrie Meres, PharmD, [place] [phone]

## 2017-08-04 ENCOUNTER — Encounter: Payer: Self-pay | Admitting: Pharmacist

## 2017-08-04 ENCOUNTER — Other Ambulatory Visit: Payer: Self-pay | Admitting: Pharmacist

## 2017-08-04 NOTE — Patient Outreach (Signed)
[facility]) Care Management  08/04/2017  Scott Anderson 1965/10/30 [id number]  Unsuccessful phone follow-up to patient.  Unable to leave patient a message.   Plan:  Will make second phone outreach in the next week.   Tommye StandardKevin Sharilyn Geisinger, PharmD, [facility] Clinical Pharmacist [facility] [phone]

## 2017-08-06 ENCOUNTER — Other Ambulatory Visit: Payer: Self-pay | Admitting: Pharmacist

## 2017-08-06 NOTE — Patient Outreach (Addendum)
[facility]) Care Management  08/06/2017  Scott Anderson 06, 1967 [id number]  Successful phone outreach to patient.   Patient reports he did not follow-[facility] yet regarding medical benefits due to his spouse being sick.  Discussed with patient unfortunately, until he follows up with [facility] regarding his benefits, patient assistance options are limited.   Did mention U-500 insulin to Dr Everardo AllEllison to see if it is an option given quantity of insulin patient needs, and MD did not wish to pursue at this time.    Patient was counseled he needs to follow up with [facility].    Plan:  Will close this case at this time as patient has not yet followed up with [facility].    He was counseled he can contact [facility] if he finds out he does not have [facility] medical benefits but he feels he may not know until next year.   Will let Dr Everardo AllEllison know of case closure.   Tommye StandardKevin Tye Juarez, PharmD, [facility] Clinical Pharmacist [facility] [phone]

## 2017-09-30 ENCOUNTER — Ambulatory Visit: Payer: PPO | Admitting: Sports Medicine

## 2017-10-06 ENCOUNTER — Encounter: Payer: Self-pay | Admitting: Endocrinology

## 2017-10-06 ENCOUNTER — Ambulatory Visit (INDEPENDENT_AMBULATORY_CARE_PROVIDER_SITE_OTHER): Payer: PPO | Admitting: Endocrinology

## 2017-10-06 VITALS — BP 168/98 | HR 110 | Wt 300.0 lb

## 2017-10-06 DIAGNOSIS — E1142 Type 2 diabetes mellitus with diabetic polyneuropathy: Secondary | ICD-10-CM | POA: Diagnosis not present

## 2017-10-06 DIAGNOSIS — Z794 Long term (current) use of insulin: Secondary | ICD-10-CM | POA: Diagnosis not present

## 2017-10-06 LAB — POCT GLYCOSYLATED HEMOGLOBIN (HGB A1C): HEMOGLOBIN A1C: 12.2

## 2017-10-06 NOTE — Patient Instructions (Addendum)
Please continue the same toujeo. If you have the symptoms again, check your blood sugar.  If your sugar is not low then, this would mean the insulin is not the cause.   [facility] see the [facility], as scheduled.  check your blood sugar once a day.  vary the time of day when you check, between before the 3 meals, and at bedtime.  also check if you have symptoms of your blood sugar being too high or too low.  please keep a record of the readings and bring it to your next appointment here (or you can bring the meter itself).  You can write it on any piece of paper.  please call us sooner if your blood sugar goes below 70, or if you have a lot of readings over 200.  Please get set up with a new primary care provider in [place].  Please come back for a follow-up appointment in 3 months.     .Marland Kitchen

## 2017-10-06 NOTE — Progress Notes (Signed)
Subjective:    Patient ID: Scott Anderson, male    DOB: 03/07/1966, 52 y.o.   MRN: [id number]  HPI Pt returns for f/u of diabetes mellitus: DM type: Insulin-requiring type 2 Dx'ed: 2010.   Complications: painful polyneuropathy.  Therapy: insulin since dx, and trulicity.  DKA: never.  Severe hypoglycemia: never.   Pancreatitis: never.   Other: he takes multiple daily injections;   Ins has capped lantus coverage at 300 units qd; he was dismissed by PCP, and does not have another.   Interval history: He has appt at [facility] in late March.  He does not have a PCP.   He tried taking toujeo 500 units.  He says a few hrs later, he developed severe body pain.   Past Medical History:  Diagnosis Date  . Diabetes mellitus without complication (HCC)   . Hypertension   . Neuropathy     History reviewed. No pertinent surgical history.  Social History   Socioeconomic History  . Marital status: Married    Spouse name: Not on file  . Number of children: Not on file  . Years of education: Not on file  . Highest education level: Not on file  Social Needs  . Financial resource strain: Not on file  . Food insecurity - worry: Not on file  . Food insecurity - inability: Not on file  . Transportation needs - medical: Not on file  . Transportation needs - non-medical: Not on file  Occupational History  . Not on file  Tobacco Use  . Smoking status: Never Smoker  . Smokeless tobacco: Never Used  Substance and Sexual Activity  . Alcohol use: No  . Drug use: No  . Sexual activity: Not on file  Other Topics Concern  . Not on file  Social History Narrative  . Not on file    Current Outpatient Medications on File Prior to Visit  Medication Sig Dispense Refill  . clomiPHENE (CLOMID) 50 MG tablet Take 0.5 tablets (25 mg total) by mouth daily. 10 tablet 0  . diazepam (VALIUM) 5 MG tablet TK 1 T PO  TID PRN  0  . fenofibrate 160 MG tablet TK 1 T PO D  3  . gabapentin (NEURONTIN) 300 MG capsule 3  (three) times daily.   1  . Insulin Glargine (TOUJEO SOLOSTAR) 300 UNIT/ML SOPN Inject 500 Units into the skin every morning. And pen needles 2/day 21 pen 11  . Insulin Syringe-Needle U-100 (B-D INS SYRINGE 2CC/29GX1/2") 29G X 1/2" 2 ML MISC Used for daily insulin injections 2x. 100 each 4  . levothyroxine (SYNTHROID, LEVOTHROID) 75 MCG tablet Take 1 tablet (75 mcg total) by mouth daily. 90 tablet 3  . lisinopril (PRINIVIL,ZESTRIL) 40 MG tablet TK 1 T PO D  5  . metFORMIN (GLUCOPHAGE) 1000 MG tablet Take 1,000 mg by mouth 2 (two) times daily with a meal.    . metoprolol tartrate (LOPRESSOR) 25 MG tablet   4  . potassium chloride (K-DUR,KLOR-CON) 10 MEQ tablet Take 10 mEq by mouth 2 (two) times daily. Reported on 11/26/2015    . PROAIR HFA 108 (90 Base) MCG/ACT inhaler INL 2 PFS PO Q 6 H PRN  5  . sildenafil (REVATIO) 20 MG tablet 2-5 tabs, as needed for ED symptoms 50 tablet 11   No current facility-administered medications on file prior to visit.     No Known Allergies  Family History  Problem Relation Age of Onset  . Diabetes Mother  BP (!) 168/98 (BP Location: Left Arm, Patient Position: Sitting, Cuff Size: Normal)   Pulse (!) 110   Wt 300 lb (136.1 kg)   SpO2 93%   BMI 44.30 kg/m   Review of Systems He denies hypoglycemia    Objective:   Physical Exam VITAL SIGNS:  See vs page.  GENERAL: no distress.  Pulses: foot pulses are intact bilaterally.   MSK: no deformity of the feet or ankles.  CV: 1+ bilar edema of the legs, and bilat vv;s Skin:  no ulcer on the feet or ankles.  normal temp on the feet and ankles.  There is hyperpigmentation of the legs Neuro: sensation is intact to touch on the feet and ankles, but decreased from normal.  Ext: There is bilateral onychomycosis of the toenails.   A1c=12.2%     Assessment & Plan:  Insulin-requiring type 2 DM, with polyneuropathy: ongoing poor glycemic control Generalized body pain, new: Could be cramping from insulin, but  this is unlikely with timed-release insulin   Patient Instructions  Please continue the same toujeo. If you have the symptoms again, check your blood sugar.  If your sugar is not low then, this would mean the insulin is not the cause.   [facility], [facility].  check your blood sugar once a day.  vary the time of day when you check, between before the 3 meals, and at bedtime.  also check if you have symptoms of your blood sugar being too high or too low.  please keep a record of the readings and bring it to your next appointment here (or you can bring the meter itself).  You can write it on any piece of paper.  please call us sooner if your blood sugar goes below 70, or if you have a lot of readings over 200.  Please get set up with a new primary care provider in [place].  Please come back for a follow-up appointment in 3 months.     .Marland Kitchen

## 2017-10-14 ENCOUNTER — Ambulatory Visit: Payer: PPO | Admitting: Sports Medicine

## 2017-11-04 ENCOUNTER — Encounter: Payer: Self-pay | Admitting: Sports Medicine

## 2017-11-04 ENCOUNTER — Ambulatory Visit: Payer: PPO | Admitting: Sports Medicine

## 2017-11-04 DIAGNOSIS — M79672 Pain in left foot: Secondary | ICD-10-CM | POA: Diagnosis not present

## 2017-11-04 DIAGNOSIS — E1142 Type 2 diabetes mellitus with diabetic polyneuropathy: Secondary | ICD-10-CM

## 2017-11-04 DIAGNOSIS — B351 Tinea unguium: Secondary | ICD-10-CM

## 2017-11-04 DIAGNOSIS — M79671 Pain in right foot: Secondary | ICD-10-CM

## 2017-11-04 NOTE — Progress Notes (Signed)
Patient ID: Scott Anderson, male   DOB: 10-29-1965, 52 y.o.   MRN: [id number]   Subjective: Scott Anderson is a 52 y.o. male patient with history of type 2 diabetes who returns to office today complaining of long, painful nails  while ambulating in shoes; unable to trim. Reports his glucose reading this morning was not recorded; reports that he is currently with him for a new primary care doctor last visit was in December however he is looking to [facility]. Denies any other pedal complaints.  Patient Active Problem List   Diagnosis Date Noted  . Pituitary insufficiency (HCC) 12/11/2015  . Hypogonadism male 11/29/2015  . Erectile dysfunction 11/26/2015  . Morbid obesity (HCC) 11/26/2015  . Diabetes (HCC) 11/26/2015  . HTN (hypertension) 11/26/2015  . Hypothyroidism 11/26/2015  . Anxiety state 11/26/2015  . Hypertriglyceridemia 11/26/2015   Current Outpatient Medications on File Prior to Visit  Medication Sig Dispense Refill  . clomiPHENE (CLOMID) 50 MG tablet Take 0.5 tablets (25 mg total) by mouth daily. 10 tablet 0  . diazepam (VALIUM) 5 MG tablet TK 1 T PO  TID PRN  0  . fenofibrate 160 MG tablet TK 1 T PO D  3  . gabapentin (NEURONTIN) 300 MG capsule 3 (three) times daily.   1  . Insulin Glargine (TOUJEO SOLOSTAR) 300 UNIT/ML SOPN Inject 500 Units into the skin every morning. And pen needles 2/day 21 pen 11  . Insulin Syringe-Needle U-100 (B-D INS SYRINGE 2CC/29GX1/2") 29G X 1/2" 2 ML MISC Used for daily insulin injections 2x. 100 each 4  . levothyroxine (SYNTHROID, LEVOTHROID) 75 MCG tablet Take 1 tablet (75 mcg total) by mouth daily. 90 tablet 3  . lisinopril (PRINIVIL,ZESTRIL) 40 MG tablet TK 1 T PO D  5  . metFORMIN (GLUCOPHAGE) 1000 MG tablet Take 1,000 mg by mouth 2 (two) times daily with a meal.    . metoprolol tartrate (LOPRESSOR) 25 MG tablet   4  . potassium chloride (K-DUR,KLOR-CON) 10 MEQ tablet Take 10 mEq by mouth 2  (two) times daily. Reported on 11/26/2015    . PROAIR HFA 108 (90 Base) MCG/ACT inhaler INL 2 PFS PO Q 6 H PRN  5  . sildenafil (REVATIO) 20 MG tablet 2-5 tabs, as needed for ED symptoms 50 tablet 11   No current facility-administered medications on file prior to visit.    No Known Allergies  Recent Results (from the past 2160 hour(s))  POCT HgB A1C     Status: None   Collection Time: 10/06/17  1:24 PM  Result Value Ref Range   Hemoglobin A1C 12.2     Objective: General: Patient is awake, alert, and oriented x 3 and in no acute distress.  Integument: Skin is warm, dry and supple bilateral. Nails are tender, long, thickened and dystrophic with subungual debris, consistent with onychomycosis,the right second toenail has healed well with no signs of infection. No open lesions or preulcerative lesions present bilateral. Remaining integument unremarkable.  Vasculature:  Dorsalis Pedis pulse 2/4 bilateral. Posterior Tibial pulse  1/4 bilateral. Capillary fill time <3 sec 1-5 bilateral. Scant hair growth to the level of the digits.Temperature gradient within normal limits. No varicosities present bilateral. No edema present bilateral.   Neurology: The patient has intact sensation measured with a 5.07/10g Semmes Weinstein Monofilament at all pedal sites bilateral. Vibratory sensation diminished bilateral with tuning fork. No Babinski sign present bilateral.   Musculoskeletal: No gross  pedal deformities noted bilateral. Muscular strength 5/5 in all lower extremity muscular groups bilateral without pain or limitation on range of motion. No tenderness with calf compression bilateral.  Assessment and Plan: Problem List Items Addressed This Visit    None    Visit Diagnoses    Dermatophytosis of nail    -  Primary   Diabetic polyneuropathy associated with type 2 diabetes mellitus (HCC)       Foot pain, bilateral         -Examined patient. -Discussed and educated patient on diabetic foot care,  especially with  regards to the vascular, neurological and musculoskeletal systems.  -Stressed the importance of good glycemic control and the detriment of not controlling glucose levels in relation to the foot. -Mechanically debrided all nails 1 through 5 bilateral and filed with dremel without incident.  -Answered all patient questions -Patient to return as needed or in 3 months for at risk foot care -Patient advised to call the office if any problems or questions arise in the meantime.  Scott Anderson, DPM

## 2017-11-19 ENCOUNTER — Telehealth: Payer: Self-pay | Admitting: Endocrinology

## 2017-11-19 NOTE — Telephone Encounter (Signed)
Insulin Glargine (TOUJEO SOLOSTAR) 300 UNIT/ML SOPN   [facility] stated that [facility] is [facility]. They have put him on Lantis and they would like him to ask Dr on how he should use the injections and were he should be injecting these  Please advise

## 2017-11-20 ENCOUNTER — Telehealth: Payer: Self-pay | Admitting: Endocrinology

## 2017-11-20 DIAGNOSIS — E1142 Type 2 diabetes mellitus with diabetic polyneuropathy: Secondary | ICD-10-CM

## 2017-11-20 DIAGNOSIS — Z794 Long term (current) use of insulin: Principal | ICD-10-CM

## 2017-11-20 NOTE — Telephone Encounter (Signed)
I called and left VM for patient to call back about yesterday's message.

## 2017-11-20 NOTE — Telephone Encounter (Signed)
Patient is returning your call.  

## 2017-11-23 ENCOUNTER — Other Ambulatory Visit: Payer: Self-pay

## 2017-11-23 MED ORDER — "INSULIN SYRINGE-NEEDLE U-100 29G X 1/2"" 2 ML MISC": 5 refills | Status: AC

## 2017-11-23 NOTE — Telephone Encounter (Signed)
No need to split it up. Please see Bonita QuinLinda.  you will receive a phone call, about a day and time for an appointment

## 2017-11-23 NOTE — Telephone Encounter (Signed)
Patient notified

## 2017-11-23 NOTE — Telephone Encounter (Signed)
Patient called and stated that [facility] his insulin to Lantus because they did not carry Toujeo. They have him on 400 units daily. Patient wants to know how to give these injections even though I tried to explain best I could over the phone to him. He also wanted to know since the amount was so high if he should spilt the dose? I told him I would ask but that was usually 24 hour acting insulin & not for splitting up. I have sent him in syringes to his pharmacy.

## 2017-12-07 IMAGING — MR MR HEAD WO/W CM
19 series · 48 of 48 positions shown · IV contrast (multihance)
Comparison: Head CT 09/26/2014

CLINICAL DATA: Pituitary insufficiency. Low testosterone.
Hypertension.

Creatinine was obtained on site at [HOSPITAL] at [HOSPITAL].
Results: Creatinine 0.8 mg/dL.
EXAM:
MRI HEAD WITHOUT AND WITH CONTRAST
TECHNIQUE: Multiplanar, multiecho pulse sequences of the brain and surrounding
structures were obtained without and with intravenous contrast.
CONTRAST:  10mL MULTIHANCE GADOBENATE DIMEGLUMINE 529 MG/ML IV SOLN

[Series 5: T1 · sagittal · 4.0mm · 0.72mm/px · 1 of 29 slices shown (1 of 5)]
[im 1/29]
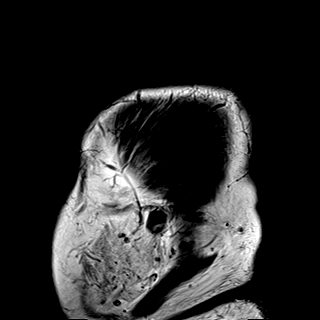

[Series 6: DWI · axial · 3.0mm · 1.44mm/px · z∈[-82,+75]mm · 6 of 84 slices shown (1 of 2)]
[im 1/84]
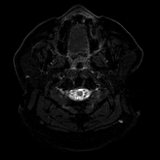
[im 17/84]
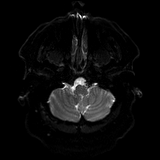
[im 34/84]
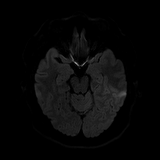
[im 50/84]
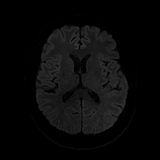
[im 67/84]
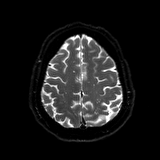
[im 84/84]
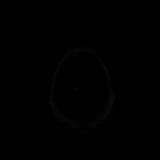

[Series 7: DWI · axial · 3.0mm · 1.44mm/px · z∈[-82,+75]mm · 3 of 42 slices shown (2 of 2)]
[im 1/42]
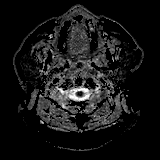
[im 21/42]
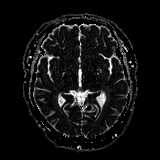
[im 42/42]
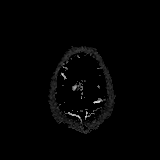

[Series 8: T2 · axial · 4.0mm · 0.36mm/px · z∈[-75,+68]mm · 2 of 29 slices shown]
[im 1/29]
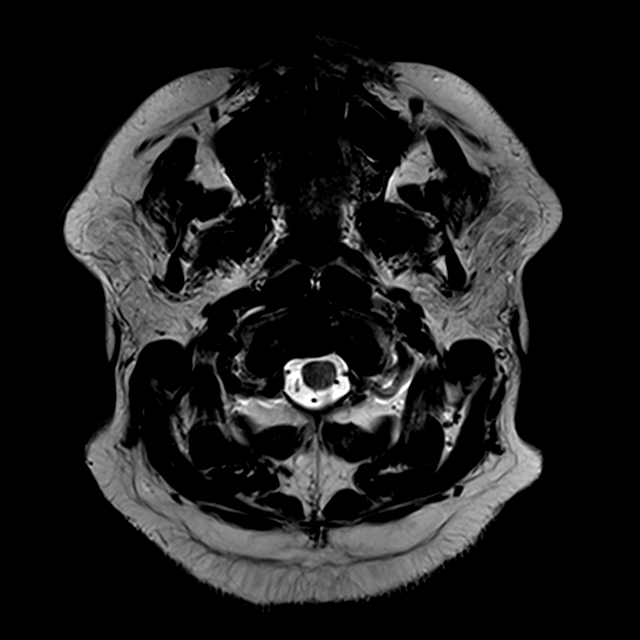
[im 29/29]
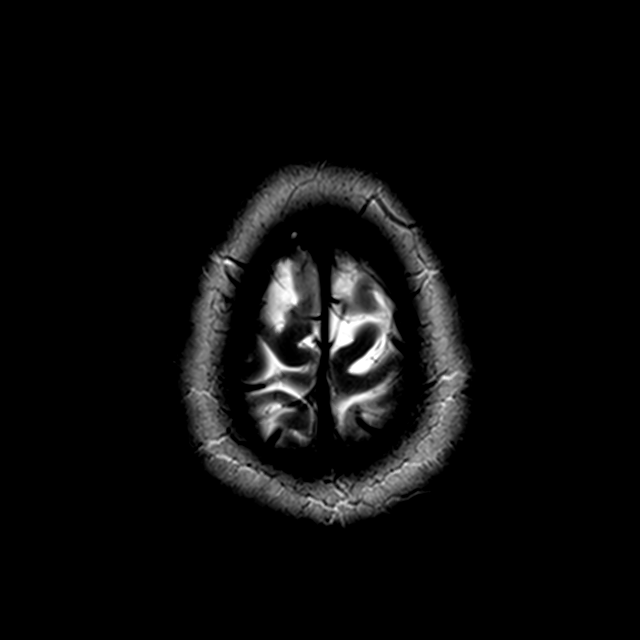

[Series 9: GRE · axial · 4.0mm · 0.69mm/px · z∈[-76,+68]mm · 5 of 58 slices shown]
[im 1/58]
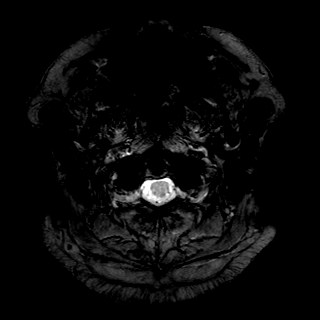
[im 15/58]
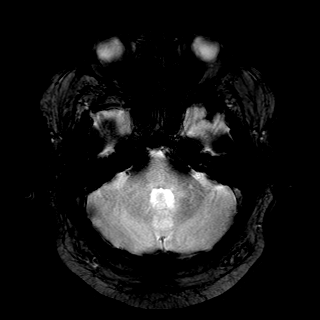
[im 29/58]
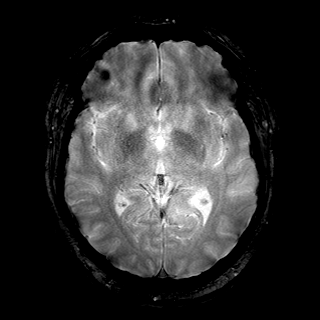
[im 43/58]
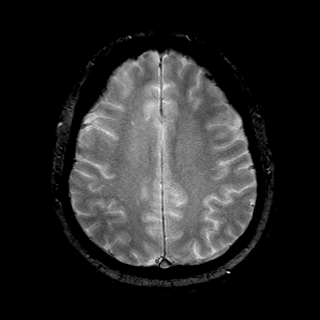
[im 58/58]
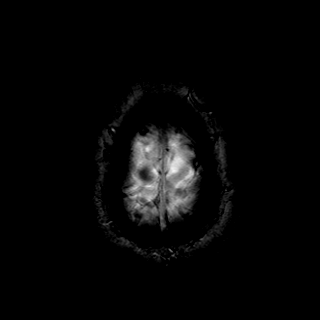

[Series 10: FLAIR · axial · 4.0mm · 0.72mm/px · z∈[-75,+68]mm · 2 of 29 slices shown]
[im 1/29]
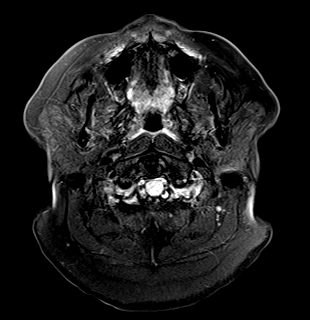
[im 29/29]
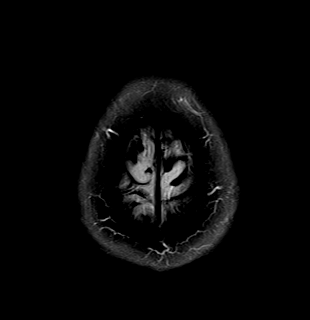

[Series 11: T1 · coronal · 3.0mm · 0.42mm/px · 1 of 10 slices shown (2 of 5)]
[im 1/10]
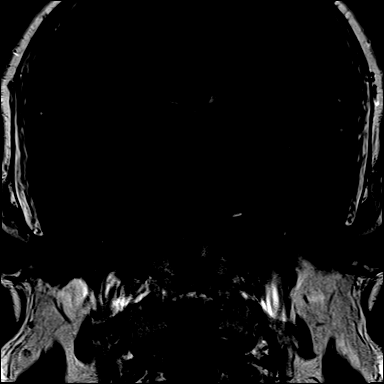

[Series 12: T1 · sagittal · 3.0mm · 0.42mm/px · 1 of 13 slices shown (3 of 5)]
[im 1/13]
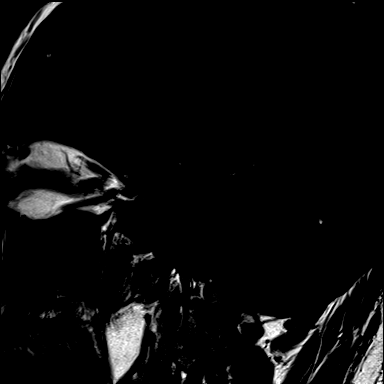

[Series 13: T1 · coronal · non-contrast · 3.0mm · 0.62mm/px · 1 of 7 slices shown (4 of 5)]
[im 1/7]
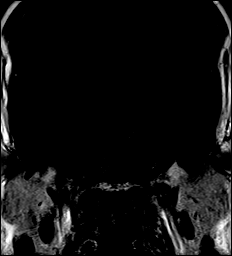

[Series 14: cor post dyn · coronal · 3.0mm · 0.62mm/px · 1 of 7 slices shown (1 of 6)]
[im 1/7]
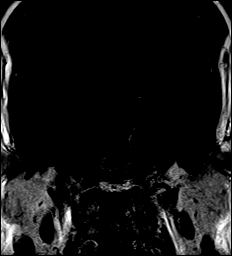

[Series 15: cor post dyn · coronal · 3.0mm · 0.62mm/px · 1 of 7 slices shown (2 of 6)]
[im 1/7]
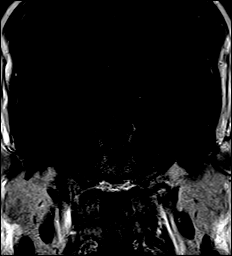

[Series 16: cor post dyn · coronal · 3.0mm · 0.62mm/px · 1 of 7 slices shown (3 of 6)]
[im 1/7]
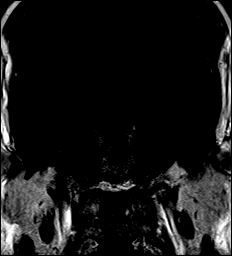

[Series 17: cor post dyn · coronal · 3.0mm · 0.62mm/px · 1 of 7 slices shown (4 of 6)]
[im 1/7]
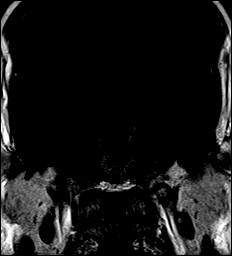

[Series 18: cor post dyn · coronal · 3.0mm · 0.62mm/px · 1 of 7 slices shown (5 of 6)]
[im 1/7]
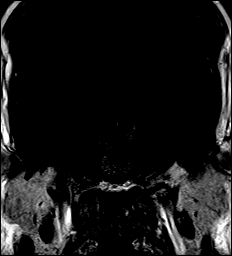

[Series 19: cor post dyn · coronal · 3.0mm · 0.62mm/px · 1 of 7 slices shown (6 of 6)]
[im 1/7]
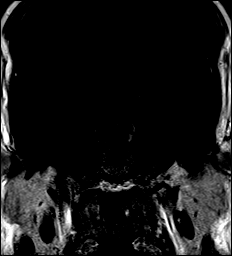

[Series 20: T1 post-contrast · sagittal · 3.0mm · 0.42mm/px · 1 of 13 slices shown (1 of 3)]
[im 1/13]
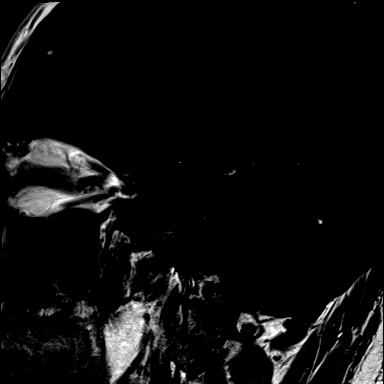

[Series 21: T1 post-contrast · coronal · 3.0mm · 0.42mm/px · 1 of 10 slices shown (2 of 3)]
[im 1/10]
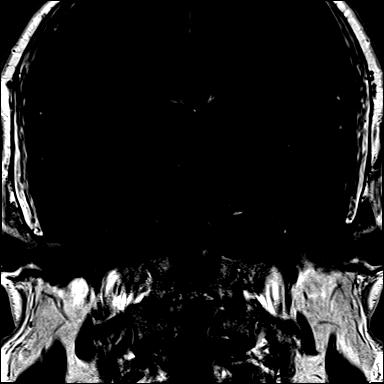

[Series 22: T1 · axial · 1.0mm · 0.86mm/px · z∈[-90,+82]mm · 15 of 176 slices shown (5 of 5)]
[im 1/176]
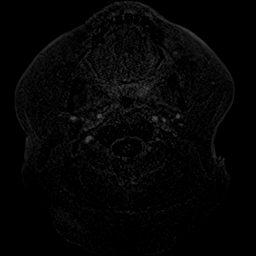
[im 13/176]
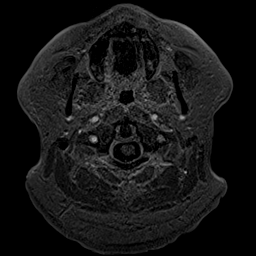
[im 26/176]
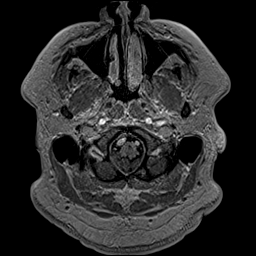
[im 38/176]
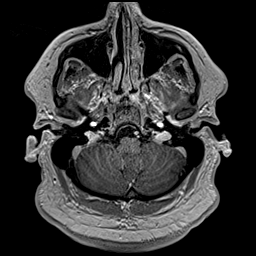
[im 51/176]
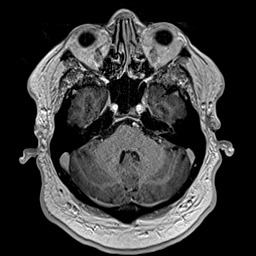
[im 63/176]
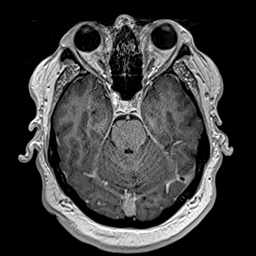
[im 76/176]
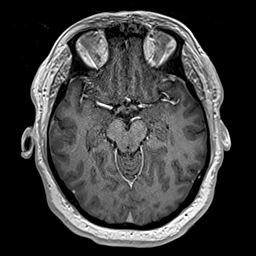
[im 88/176]
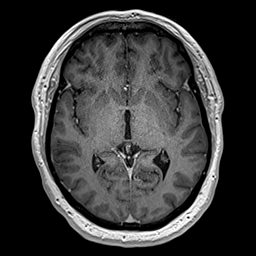
[im 101/176]
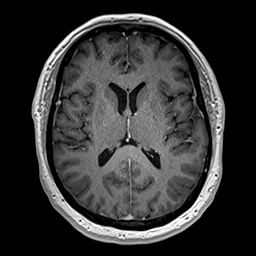
[im 113/176]
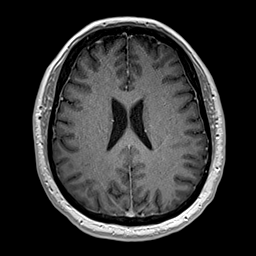
[im 126/176]
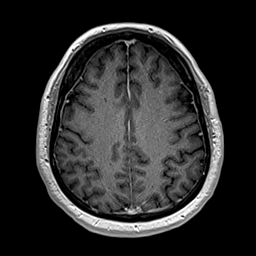
[im 138/176]
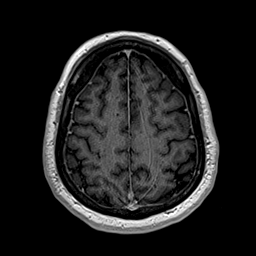
[im 151/176]
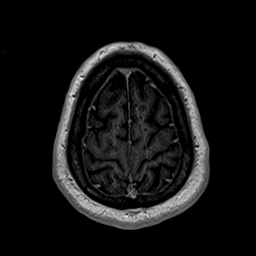
[im 163/176]
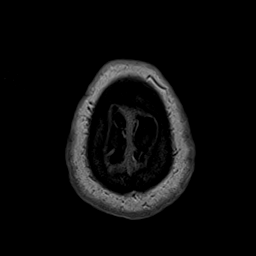
[im 176/176]
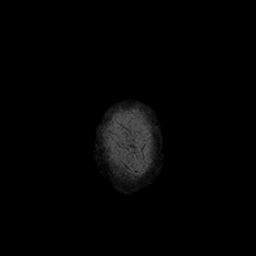

[Series 23: T1 post-contrast · coronal · 4.0mm · 0.47mm/px · 3 of 30 slices shown (3 of 3)]
[im 1/30]
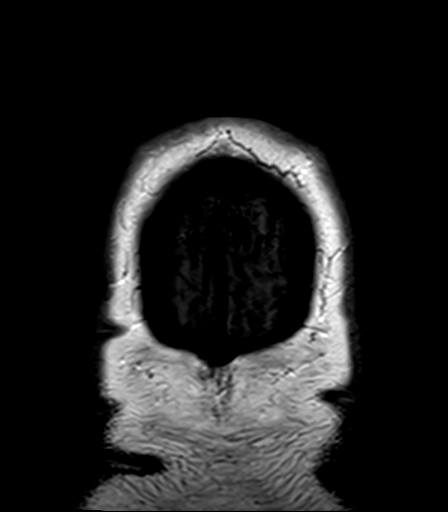
[im 15/30]
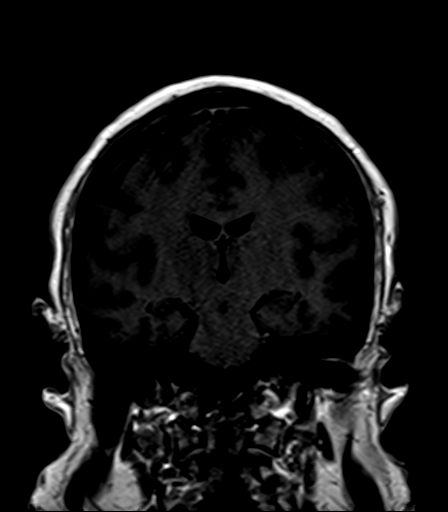
[im 30/30]
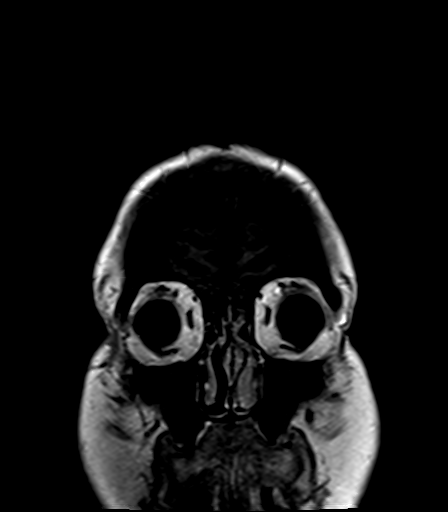

[48 of 48 positions shown; findings below may reference images not displayed]

FINDINGS: Mild cerebellar tonsillar ectopia measures 4 mm, not meeting
criteria for Chiari I malformation. There is no evidence of acute
infarct, intracranial hemorrhage, mass, midline shift, or
extra-axial fluid collection. Ventricles and sulci are normal. The
minimal periventricular white matter T2 hyperintensity is noted,
greatest in the left periatrial region. No abnormal brain
parenchymal or meningeal enhancement is identified. Dilated
perivascular spaces are incidentally noted inferiorly in the basal
ganglia bilaterally.

Orbits are unremarkable. Paranasal sinuses and mastoid air cells are
clear. Major intracranial vascular flow voids are preserved.

Dedicated imaging was performed through the sella turcica to
evaluate the pituitary gland. The pituitary gland is overall normal
in size and enhances relatively homogeneously without a discrete
lesion identified. Infundibulum is midline. Optic chiasm and
cavernous sinuses are unremarkable.
IMPRESSION: 1. No pituitary lesion identified.
2. Minimal periventricular white matter T2 signal change,
nonspecific but likely reflecting chronic small vessel ischemia
given history of hypertension and diabetes.

## 2018-01-01 ENCOUNTER — Telehealth: Payer: Self-pay | Admitting: General Practice

## 2018-01-01 NOTE — Telephone Encounter (Signed)
Pt called to cancel his appointment on Monday [facility]

## 2018-01-04 ENCOUNTER — Ambulatory Visit: Payer: Self-pay | Admitting: Endocrinology

## 2018-02-04 ENCOUNTER — Ambulatory Visit: Payer: PPO | Admitting: Sports Medicine

## 2018-02-17 ENCOUNTER — Ambulatory Visit: Payer: PPO | Admitting: Sports Medicine

## 2018-04-08 ENCOUNTER — Ambulatory Visit: Payer: PPO | Admitting: Sports Medicine

## 2018-06-02 DIAGNOSIS — K5730 Diverticulosis of large intestine without perforation or abscess without bleeding: Secondary | ICD-10-CM | POA: Diagnosis not present

## 2018-06-02 DIAGNOSIS — K648 Other hemorrhoids: Secondary | ICD-10-CM | POA: Diagnosis not present

## 2018-06-02 DIAGNOSIS — Z1211 Encounter for screening for malignant neoplasm of colon: Secondary | ICD-10-CM | POA: Diagnosis not present

## 2018-08-30 ENCOUNTER — Ambulatory Visit (INDEPENDENT_AMBULATORY_CARE_PROVIDER_SITE_OTHER): Payer: PPO

## 2018-08-30 ENCOUNTER — Ambulatory Visit: Payer: PPO | Admitting: Podiatry

## 2018-08-30 ENCOUNTER — Encounter: Payer: Self-pay | Admitting: Podiatry

## 2018-08-30 VITALS — Temp 97.8°F

## 2018-08-30 DIAGNOSIS — L02612 Cutaneous abscess of left foot: Secondary | ICD-10-CM | POA: Diagnosis not present

## 2018-08-30 DIAGNOSIS — L03032 Cellulitis of left toe: Secondary | ICD-10-CM

## 2018-08-30 DIAGNOSIS — E11621 Type 2 diabetes mellitus with foot ulcer: Secondary | ICD-10-CM | POA: Diagnosis not present

## 2018-08-30 DIAGNOSIS — L97521 Non-pressure chronic ulcer of other part of left foot limited to breakdown of skin: Secondary | ICD-10-CM | POA: Diagnosis not present

## 2018-08-30 DIAGNOSIS — L97526 Non-pressure chronic ulcer of other part of left foot with bone involvement without evidence of necrosis: Secondary | ICD-10-CM | POA: Diagnosis not present

## 2018-08-30 NOTE — Progress Notes (Signed)
Subjective:  Patient ID: Scott Anderson, male    DOB: 05/06/1966,  MRN: [id number]  Chief Complaint  Patient presents with  . Nail Problem    stumped my left big toe last week and i have neuropathy so i did not feel it     52 y.o. male presents for wound care.  He stubbed his left big toe last week and the nail started coming up and he took the nail himself has been using Neosporin and peroxide to clean the toe.  States the toe is very red and draining endorses neuropathy and does not have much pain or sensation of the toe.  Review of Systems: Negative except as noted in the HPI. Denies N/V/F/Ch.  Past Medical History:  Diagnosis Date  . Diabetes mellitus without complication (HCC)   . Hypertension   . Neuropathy     Current Outpatient Medications:  .  clomiPHENE (CLOMID) 50 MG tablet, Take 0.5 tablets (25 mg total) by mouth daily., Disp: 10 tablet, Rfl: 0 .  diazepam (VALIUM) 5 MG tablet, TK 1 T PO  TID PRN, Disp: , Rfl: 0 .  fenofibrate 160 MG tablet, TK 1 T PO D, Disp: , Rfl: 3 .  gabapentin (NEURONTIN) 300 MG capsule, 3 (three) times daily. , Disp: , Rfl: 1 .  Insulin Glargine (TOUJEO SOLOSTAR) 300 UNIT/ML SOPN, Inject 500 Units into the skin every morning. And pen needles 2/day, Disp: 21 pen, Rfl: 11 .  Insulin Syringe-Needle U-100 (B-D INS SYRINGE 2CC/29GX1/2") 29G X 1/2" 2 ML MISC, Used for daily insulin injections 2x., Disp: 200 each, Rfl: 5 .  levothyroxine (SYNTHROID, LEVOTHROID) 75 MCG tablet, Take 1 tablet (75 mcg total) by mouth daily., Disp: 90 tablet, Rfl: 3 .  lisinopril (PRINIVIL,ZESTRIL) 40 MG tablet, TK 1 T PO D, Disp: , Rfl: 5 .  metFORMIN (GLUCOPHAGE) 1000 MG tablet, Take 1,000 mg by mouth 2 (two) times daily with a meal., Disp: , Rfl:  .  metoprolol tartrate (LOPRESSOR) 25 MG tablet, , Disp: , Rfl: 4 .  potassium chloride (K-DUR,KLOR-CON) 10 MEQ tablet, Take 10 mEq by mouth 2 (two) times daily. Reported on 11/26/2015, Disp: , Rfl:  .  PROAIR HFA 108 (90 Base)  MCG/ACT inhaler, INL 2 PFS PO Q 6 H PRN, Disp: , Rfl: 5 .  sildenafil (REVATIO) 20 MG tablet, 2-5 tabs, as needed for ED symptoms, Disp: 50 tablet, Rfl: 11  Social History   Tobacco Use  Smoking Status Never Smoker  Smokeless Tobacco Never Used    No Known Allergies Objective:   Vitals:   08/30/18 1431  Temp: 97.8 F (36.6 C)   There is no height or weight on file to calculate BMI. Constitutional Well developed. Well nourished.  Vascular Dorsalis pedis pulses palpable bilaterally. Posterior tibial pulses palpable bilaterally. Capillary refill normal to all digits.  No cyanosis or clubbing noted. Pedal hair growth normal.  Neurologic Normal speech. Oriented to person, place, and time. Protective sensation absent  Dermatologic Wound Location: Left great toe Wound Base: Mixed Granular/Fibrotic Peri-wound: Reddened, Macerated Exudate: Moderate amount Serosanguinous exudate -Wound base fibro-granular -Dog hair present in the wound base -Erythema extending proximally to about the MPJ -Exposed distal phalanx bone present in the wound  Orthopedic: No pain to palpation either foot.   Radiographs: Taken reviewed no definite events of osteomyelitis or bone erosion no acute fracture dislocation Assessment:   1. Cellulitis and abscess of toe, left   2. Diabetic ulcer of toe of left foot  associated with type 2 diabetes mellitus, with bone involvement without evidence of necrosis [facility](HCC)    Plan:  Patient was evaluated and treated and all questions answered.  Ulcer L Great Toe with Visible Bone, No XR e/o OM, cellulitis of the toe -Debridement as below.  Excisional debridement of the bone was carried down to level of bone -Dressed with silvadene, DSD -Dispense surgical shoe -Rx Clinda and Cipro -Discussed the likelihood of possible bone resection in the future due to exposed bone -Discussed importance of keeping the wound clean and free from sources of infection such as his  dogs.  Procedure: Excisional Debridement of Wound Rationale: Removal of non-viable soft tissue from the wound to promote healing.  Anesthesia: none Pre-Debridement Wound Measurements: 0.5 cm x 0.5 cm x 0.2 cm  Post-Debridement Wound Measurements: 1 cm x 1 cm x 0.3 cm  Type of Debridement: Sharp Excisional Tissue Removed: Non-viable soft tissue Depth of Debridement: bone Technique: Sharp excisional debridement to bleeding, viable wound base.  Dressing: Dry, sterile, compression dressing. Disposition: Patient tolerated procedure well. Patient to return in 1 week for follow-up.  No follow-ups on file.

## 2018-08-31 ENCOUNTER — Telehealth: Payer: Self-pay | Admitting: *Deleted

## 2018-08-31 ENCOUNTER — Telehealth: Payer: Self-pay | Admitting: Podiatry

## 2018-08-31 MED ORDER — CLINDAMYCIN HCL 150 MG PO CAPS: 150.0000 mg | ORAL_CAPSULE | Freq: Two times a day (BID) | ORAL | 0 refills | Status: DC

## 2018-08-31 MED ORDER — CIPROFLOXACIN HCL 500 MG PO TABS: 500.0000 mg | ORAL_TABLET | Freq: Two times a day (BID) | ORAL | 0 refills | Status: DC

## 2018-08-31 NOTE — Telephone Encounter (Signed)
Pt was seen yesterday and was supposed to have 2 antibiotics prescribed but pharmacy has not received. Please give pt a call.

## 2018-08-31 NOTE — Telephone Encounter (Signed)
Faxed required form and demographics to [facility].

## 2018-08-31 NOTE — Addendum Note (Signed)
Addended by: Ventura SellersPRICE, MICHAEL on: 08/31/2018 02:48 PM   Modules accepted: Orders

## 2018-08-31 NOTE — Telephone Encounter (Signed)
-----   Message from Park LiterMichael J Price, DPM sent at 08/31/2018  2:48 PM EST ----- Can we order [facility] supplies for this patient - Aquacell, 4x4s, kling wrap, tape

## 2018-09-02 ENCOUNTER — Ambulatory Visit (INDEPENDENT_AMBULATORY_CARE_PROVIDER_SITE_OTHER): Payer: PPO | Admitting: Sports Medicine

## 2018-09-02 ENCOUNTER — Other Ambulatory Visit: Payer: Self-pay

## 2018-09-02 ENCOUNTER — Encounter: Payer: Self-pay | Admitting: Sports Medicine

## 2018-09-02 VITALS — BP 129/83 | HR 111 | Temp 97.1°F | Resp 16

## 2018-09-02 DIAGNOSIS — E11621 Type 2 diabetes mellitus with foot ulcer: Secondary | ICD-10-CM

## 2018-09-02 DIAGNOSIS — L97526 Non-pressure chronic ulcer of other part of left foot with bone involvement without evidence of necrosis: Secondary | ICD-10-CM

## 2018-09-02 DIAGNOSIS — L02612 Cutaneous abscess of left foot: Secondary | ICD-10-CM | POA: Diagnosis not present

## 2018-09-02 DIAGNOSIS — L03032 Cellulitis of left toe: Secondary | ICD-10-CM

## 2018-09-02 NOTE — Progress Notes (Signed)
Subjective: Scott Anderson is a 53 y.o. male patient seen in office for evaluation of ulceration of the left great toe.  Patient has a history of diabetes and a blood glucose level today not recorded.  Patient states that he picked up his antibiotics and has been taking them since Wednesday and has been using his surgical shoe and Neosporin states that it looks a little better but still thinks it looks bad with yellow drainage swelling and redness.   Patient is changing the dressing using Neosporin at home. Denies nausea/fever/vomiting/chills/night sweats/shortness of breath/pain. Patient has no other pedal complaints at this time.  Patient Active Problem List   Diagnosis Date Noted  . Pituitary insufficiency (HCC) 12/11/2015  . Hypogonadism male 11/29/2015  . Erectile dysfunction 11/26/2015  . Morbid obesity (HCC) 11/26/2015  . Diabetes (HCC) 11/26/2015  . HTN (hypertension) 11/26/2015  . Hypothyroidism 11/26/2015  . Anxiety state 11/26/2015  . Hypertriglyceridemia 11/26/2015   Current Outpatient Medications on File Prior to Visit  Medication Sig Dispense Refill  . ciprofloxacin (CIPRO) 500 MG tablet Take 1 tablet (500 mg total) by mouth 2 (two) times daily. 14 tablet 0  . clindamycin (CLEOCIN) 150 MG capsule Take 1 capsule (150 mg total) by mouth 2 (two) times daily. 14 capsule 0  . clomiPHENE (CLOMID) 50 MG tablet Take 0.5 tablets (25 mg total) by mouth daily. 10 tablet 0  . diazepam (VALIUM) 5 MG tablet TK 1 T PO  TID PRN  0  . fenofibrate 160 MG tablet TK 1 T PO D  3  . gabapentin (NEURONTIN) 300 MG capsule 3 (three) times daily.   1  . Insulin Glargine (TOUJEO SOLOSTAR) 300 UNIT/ML SOPN Inject 500 Units into the skin every morning. And pen needles 2/day 21 pen 11  . Insulin Syringe-Needle U-100 (B-D INS SYRINGE 2CC/29GX1/2") 29G X 1/2" 2 ML MISC Used for daily insulin injections 2x. 200 each 5  . levothyroxine (SYNTHROID, LEVOTHROID) 75 MCG tablet Take 1 tablet (75 mcg total) by mouth  daily. 90 tablet 3  . lisinopril (PRINIVIL,ZESTRIL) 40 MG tablet TK 1 T PO D  5  . metFORMIN (GLUCOPHAGE) 1000 MG tablet Take 1,000 mg by mouth 2 (two) times daily with a meal.    . metoprolol tartrate (LOPRESSOR) 25 MG tablet   4  . potassium chloride (K-DUR,KLOR-CON) 10 MEQ tablet Take 10 mEq by mouth 2 (two) times daily. Reported on 11/26/2015    . PROAIR HFA 108 (90 Base) MCG/ACT inhaler INL 2 PFS PO Q 6 H PRN  5  . sildenafil (REVATIO) 20 MG tablet 2-5 tabs, as needed for ED symptoms 50 tablet 11   No current facility-administered medications on file prior to visit.    No Known Allergies  No results found for this or any previous visit (from the past 2160 hour(s)).  Objective: There were no vitals filed for this visit.  General: Patient is awake, alert, oriented x 3 and in no acute distress.  Dermatology: Skin is warm and dry bilateral with a full-thickness ulceration present  Distal tuft left great toe encompassing the nail bed. Ulceration measures 2 cm x 2 cm x 0.3 cm. There is a  Macerated border with a granular base with exposed bone at dorsal first toe with the nail bed. The ulceration does probe to bone at this area. There is no malodor, no active drainage, faint focal erythema, faint focal edema. No other acute signs of infection.   Vascular: Dorsalis Pedis pulse =  1/4 Bilateral,  Posterior Tibial pulse = 1/4 Bilateral,  Capillary Fill Time < 5 seconds  Neurologic: Protective sensation absent bilateral.  Musculosketal: No Pain with palpation to ulcerated area. No pain with compression to calves bilateral.    No results for input(s): GRAMSTAIN, LABORGA in the last 8760 hours.  Assessment and Plan:  Problem List Items Addressed This Visit    None    Visit Diagnoses    Diabetic ulcer of toe of left foot associated with type 2 diabetes mellitus, with bone involvement without evidence of necrosis (HCC)    -  Primary   Cellulitis and abscess of toe, left            -Examined patient and discussed the progression of the wound and treatment alternatives. -Cleansed ulceration. Wound measures as above.  -Applied betadine and dry sterile dressing and instructed patient to continue with daily dressings at home consisting of the same.  Advised patient to keep his pets away from his wound -Continue with Clinda and Cipro antibiotic and surgical shoe -Advised patient if this wound fails to heal may be at risk of having to have distal phalanx amputation however there is no acute need at this time will closely monitor for continued healing of this wound since it is slightly improving -Advised patient to go to the ER or return to office if the wound worsens or if constitutional symptoms are present. -Patient to return to office in 2 weeks for follow up care and evaluation or sooner if problems arise.  [name], DPM

## 2018-09-02 NOTE — Telephone Encounter (Signed)
Meds were re-sent patient is aware

## 2018-09-03 DIAGNOSIS — L03032 Cellulitis of left toe: Secondary | ICD-10-CM | POA: Diagnosis not present

## 2018-09-16 ENCOUNTER — Ambulatory Visit (INDEPENDENT_AMBULATORY_CARE_PROVIDER_SITE_OTHER): Payer: PPO | Admitting: Sports Medicine

## 2018-09-16 ENCOUNTER — Encounter: Payer: Self-pay | Admitting: Sports Medicine

## 2018-09-16 VITALS — BP 151/92 | HR 96 | Temp 97.6°F | Resp 16

## 2018-09-16 DIAGNOSIS — L03032 Cellulitis of left toe: Secondary | ICD-10-CM

## 2018-09-16 DIAGNOSIS — E11621 Type 2 diabetes mellitus with foot ulcer: Secondary | ICD-10-CM

## 2018-09-16 DIAGNOSIS — L97526 Non-pressure chronic ulcer of other part of left foot with bone involvement without evidence of necrosis: Secondary | ICD-10-CM | POA: Diagnosis not present

## 2018-09-16 DIAGNOSIS — L02612 Cutaneous abscess of left foot: Secondary | ICD-10-CM

## 2018-09-16 NOTE — Progress Notes (Signed)
Subjective: Scott Anderson is a 53 y.o. male patient seen in office for evaluation of ulceration of the left great toe.  Patient has a history of diabetes and a blood glucose level today not recorded.  Patient states that it looks better but don't hurt.   Patient is changing the dressing using Betadine at home. Denies nausea/fever/vomiting/chills/night sweats/shortness of breath/pain. Patient has no other pedal complaints at this time.  Patient Active Problem List   Diagnosis Date Noted  . Pituitary insufficiency (HCC) 12/11/2015  . Hypogonadism male 11/29/2015  . Erectile dysfunction 11/26/2015  . Morbid obesity (HCC) 11/26/2015  . Diabetes (HCC) 11/26/2015  . HTN (hypertension) 11/26/2015  . Hypothyroidism 11/26/2015  . Anxiety state 11/26/2015  . Hypertriglyceridemia 11/26/2015   Current Outpatient Medications on File Prior to Visit  Medication Sig Dispense Refill  . ciprofloxacin (CIPRO) 500 MG tablet Take 1 tablet (500 mg total) by mouth 2 (two) times daily. 14 tablet 0  . clindamycin (CLEOCIN) 150 MG capsule Take 1 capsule (150 mg total) by mouth 2 (two) times daily. 14 capsule 0  . clomiPHENE (CLOMID) 50 MG tablet Take 0.5 tablets (25 mg total) by mouth daily. 10 tablet 0  . diazepam (VALIUM) 5 MG tablet TK 1 T PO  TID PRN  0  . fenofibrate 160 MG tablet TK 1 T PO D  3  . gabapentin (NEURONTIN) 300 MG capsule 3 (three) times daily.   1  . gemfibrozil (LOPID) 600 MG tablet Take by mouth.    . Insulin Glargine (TOUJEO SOLOSTAR) 300 UNIT/ML SOPN Inject 500 Units into the skin every morning. And pen needles 2/day 21 pen 11  . Insulin Syringe-Needle U-100 (B-D INS SYRINGE 2CC/29GX1/2") 29G X 1/2" 2 ML MISC Used for daily insulin injections 2x. 200 each 5  . levothyroxine (SYNTHROID, LEVOTHROID) 75 MCG tablet Take 1 tablet (75 mcg total) by mouth daily. 90 tablet 3  . lisinopril (PRINIVIL,ZESTRIL) 40 MG tablet TK 1 T PO D  5  . metFORMIN (GLUCOPHAGE) 1000 MG tablet Take 1,000 mg by mouth  2 (two) times daily with a meal.    . metoprolol tartrate (LOPRESSOR) 25 MG tablet   4  . Omega-3 1000 MG CAPS Take by mouth.    . potassium chloride (K-DUR,KLOR-CON) 10 MEQ tablet Take 10 mEq by mouth 2 (two) times daily. Reported on 11/26/2015    . PROAIR HFA 108 (90 Base) MCG/ACT inhaler INL 2 PFS PO Q 6 H PRN  5  . sildenafil (REVATIO) 20 MG tablet 2-5 tabs, as needed for ED symptoms 50 tablet 11   No current facility-administered medications on file prior to visit.    No Known Allergies  No results found for this or any previous visit (from the past 2160 hour(s)).  Objective: There were no vitals filed for this visit.  General: Patient is awake, alert, oriented x 3 and in no acute distress.  Dermatology: Skin is warm and dry bilateral with a full-thickness ulceration present  Distal tuft left great toe encompassing the nail bed. Ulceration measures 2 cm x 1.5 cm x 0.3 cm. There is a  Macerated border with a granular base with decreased exposed bone at dorsal first toe with the nail bed. + dog hair in wound, The ulceration does probe to bone at this area but appears to be improving. There is no malodor, no active drainage, faint focal erythema, faint focal edema. No other acute signs of infection.   Vascular: Dorsalis Pedis pulse =  1/4 Bilateral,  Posterior Tibial pulse = 1/4 Bilateral,  Capillary Fill Time < 5 seconds  Neurologic: Protective sensation absent bilateral.  Musculosketal: No Pain with palpation to ulcerated area. No pain with compression to calves bilateral.    No results for input(s): GRAMSTAIN, LABORGA in the last 8760 hours.  Assessment and Plan:  Problem List Items Addressed This Visit    None    Visit Diagnoses    Diabetic ulcer of toe of left foot associated with type 2 diabetes mellitus, with bone involvement without evidence of necrosis (HCC)    -  Primary   improving   Cellulitis and abscess of toe, left          -Examined patient and discussed the  progression of the wound and treatment alternatives. - Excisionally dedbrided ulceration at left great toe to healthy bleeding borders removing nonviable tissue using a sterile chisel blade. Wound measures post debridement as above -Applied betadine and dry sterile dressing and instructed patient to continue with daily dressings at home consisting of the same.  Advised patient to keep his pets away from his wound like before -Continue with Clinda and Cipro antibiotic until finished and surgical shoe -Advised patient if this wound fails to heal may be at risk of having to have distal phalanx amputation however there is no acute need at this time will closely monitor for continued healing of this wound since it is continuing to improve. Expressed with patient that he needs to take the time to properly care for his wound since dog hair was all of his wound today -Advised patient to go to the ER or return to office if the wound worsens or if constitutional symptoms are present. -Patient to return to office in 2 weeks for follow up care and evaluation or sooner if problems arise.  Asencion Islamitorya Ariv Penrod, DPM

## 2018-10-07 ENCOUNTER — Ambulatory Visit: Payer: PPO | Admitting: Sports Medicine

## 2018-10-28 ENCOUNTER — Encounter: Payer: Self-pay | Admitting: Sports Medicine

## 2018-10-28 ENCOUNTER — Ambulatory Visit (INDEPENDENT_AMBULATORY_CARE_PROVIDER_SITE_OTHER): Payer: PPO | Admitting: Sports Medicine

## 2018-10-28 VITALS — BP 151/94 | HR 106 | Temp 97.8°F | Resp 16

## 2018-10-28 DIAGNOSIS — L97529 Non-pressure chronic ulcer of other part of left foot with unspecified severity: Secondary | ICD-10-CM | POA: Diagnosis not present

## 2018-10-28 DIAGNOSIS — E1142 Type 2 diabetes mellitus with diabetic polyneuropathy: Secondary | ICD-10-CM

## 2018-10-28 DIAGNOSIS — L02612 Cutaneous abscess of left foot: Secondary | ICD-10-CM

## 2018-10-28 DIAGNOSIS — L03032 Cellulitis of left toe: Secondary | ICD-10-CM

## 2018-10-28 DIAGNOSIS — E11621 Type 2 diabetes mellitus with foot ulcer: Secondary | ICD-10-CM

## 2018-10-28 NOTE — Progress Notes (Signed)
Subjective: Scott Anderson is a 53 y.o. male patient seen in office for evaluation of ulceration of the left great toe.  Patient has a history of diabetes and a blood glucose level today not recorded by two days ago was 200.  Patient states that it looks better but don't hurt with less redness and swelling.   Patient is changing the dressing using Betadine at home. Denies nausea/fever/vomiting/chills/night sweats/shortness of breath/pain. Patient has no other pedal complaints at this time.  Patient Active Problem List   Diagnosis Date Noted  . Pituitary insufficiency (HCC) 12/11/2015  . Hypogonadism male 11/29/2015  . Erectile dysfunction 11/26/2015  . Morbid obesity (HCC) 11/26/2015  . Diabetes (HCC) 11/26/2015  . HTN (hypertension) 11/26/2015  . Hypothyroidism 11/26/2015  . Anxiety state 11/26/2015  . Hypertriglyceridemia 11/26/2015   Current Outpatient Medications on File Prior to Visit  Medication Sig Dispense Refill  . clomiPHENE (CLOMID) 50 MG tablet Take 0.5 tablets (25 mg total) by mouth daily. 10 tablet 0  . diazepam (VALIUM) 5 MG tablet TK 1 T PO  TID PRN  0  . fenofibrate 160 MG tablet TK 1 T PO D  3  . gabapentin (NEURONTIN) 300 MG capsule 3 (three) times daily.   1  . gemfibrozil (LOPID) 600 MG tablet Take by mouth.    . Insulin Glargine (TOUJEO SOLOSTAR) 300 UNIT/ML SOPN Inject 500 Units into the skin every morning. And pen needles 2/day 21 pen 11  . Insulin Syringe-Needle U-100 (B-D INS SYRINGE 2CC/29GX1/2") 29G X 1/2" 2 ML MISC Used for daily insulin injections 2x. 200 each 5  . levothyroxine (SYNTHROID, LEVOTHROID) 75 MCG tablet Take 1 tablet (75 mcg total) by mouth daily. 90 tablet 3  . lisinopril (PRINIVIL,ZESTRIL) 40 MG tablet TK 1 T PO D  5  . metFORMIN (GLUCOPHAGE) 1000 MG tablet Take 1,000 mg by mouth 2 (two) times daily with a meal.    . metoprolol tartrate (LOPRESSOR) 25 MG tablet   4  . Omega-3 1000 MG CAPS Take by mouth.    . potassium chloride (K-DUR,KLOR-CON)  10 MEQ tablet Take 10 mEq by mouth 2 (two) times daily. Reported on 11/26/2015    . PROAIR HFA 108 (90 Base) MCG/ACT inhaler INL 2 PFS PO Q 6 H PRN  5  . sildenafil (REVATIO) 20 MG tablet 2-5 tabs, as needed for ED symptoms 50 tablet 11   No current facility-administered medications on file prior to visit.    No Known Allergies  No results found for this or any previous visit (from the past 2160 hour(s)).  Objective: There were no vitals filed for this visit.  General: Patient is awake, alert, oriented x 3 and in no acute distress.  Dermatology: Skin is warm and dry bilateral with a full-thickness ulceration present  Distal tuft left great toe encompassing the nail bed. Ulceration measures 0.5cm x0.3 cm x 0.2 cm. There is a granular base with no longer exposed bone at dorsal first toe with the nail bed, The ulceration does not probe to bone at this area, much improved from prior. There is no malodor, no active drainage, faint focal erythema, faint focal edema. No other acute signs of infection.   Vascular: Dorsalis Pedis pulse = 1/4 Bilateral,  Posterior Tibial pulse = 1/4 Bilateral,  Capillary Fill Time < 5 seconds  Neurologic: Protective sensation absent bilateral.  Musculosketal: No Pain with palpation to ulcerated area. No pain with compression to calves bilateral.    No results for  input(s): GRAMSTAIN, LABORGA in the last 8760 hours.  Assessment and Plan:  Problem List Items Addressed This Visit    None    Visit Diagnoses    Ulcer of left great toe due to diabetes mellitus (HCC)    -  Primary   Cellulitis and abscess of toe, left       Diabetic polyneuropathy associated with type 2 diabetes mellitus (HCC)          -Examined patient and discussed the progression of the wound and treatment alternatives. - Excisionally dedbrided ulceration at left great toe to healthy bleeding borders removing nonviable tissue using a sterile chisel blade. Wound measures post debridement as  above -Applied betadine and dry sterile dressing and instructed patient to continue with daily dressings at home consisting of the same.  Advised patient to keep his pets away from his wound like before of which he has done better with since last visit -Continue with post op shoe or shoe that does not rub 1st toe -Advised patient to go to the ER or return to office if the wound worsens or if constitutional symptoms are present. -Patient to return to office in 6 weeks for follow up care and evaluation or sooner if problems arise.  [name], DPM

## 2018-12-08 ENCOUNTER — Ambulatory Visit: Payer: PPO | Admitting: Sports Medicine

## 2019-04-29 DIAGNOSIS — E039 Hypothyroidism, unspecified: Secondary | ICD-10-CM | POA: Diagnosis not present

## 2019-04-29 DIAGNOSIS — R112 Nausea with vomiting, unspecified: Secondary | ICD-10-CM | POA: Diagnosis not present

## 2019-04-29 DIAGNOSIS — R7989 Other specified abnormal findings of blood chemistry: Secondary | ICD-10-CM | POA: Insufficient documentation

## 2019-04-29 DIAGNOSIS — R072 Precordial pain: Secondary | ICD-10-CM | POA: Diagnosis not present

## 2019-04-29 DIAGNOSIS — Z6841 Body Mass Index (BMI) 40.0 and over, adult: Secondary | ICD-10-CM | POA: Diagnosis not present

## 2019-04-29 DIAGNOSIS — R079 Chest pain, unspecified: Secondary | ICD-10-CM | POA: Diagnosis not present

## 2019-04-29 DIAGNOSIS — R7881 Bacteremia: Secondary | ICD-10-CM | POA: Diagnosis not present

## 2019-04-29 DIAGNOSIS — R600 Localized edema: Secondary | ICD-10-CM | POA: Diagnosis not present

## 2019-04-29 DIAGNOSIS — G4733 Obstructive sleep apnea (adult) (pediatric): Secondary | ICD-10-CM | POA: Diagnosis not present

## 2019-04-29 DIAGNOSIS — Z794 Long term (current) use of insulin: Secondary | ICD-10-CM | POA: Diagnosis not present

## 2019-04-29 DIAGNOSIS — Z20828 Contact with and (suspected) exposure to other viral communicable diseases: Secondary | ICD-10-CM | POA: Diagnosis not present

## 2019-04-29 DIAGNOSIS — I5023 Acute on chronic systolic (congestive) heart failure: Secondary | ICD-10-CM | POA: Diagnosis not present

## 2019-04-29 DIAGNOSIS — I5020 Unspecified systolic (congestive) heart failure: Secondary | ICD-10-CM | POA: Diagnosis not present

## 2019-04-29 DIAGNOSIS — I214 Non-ST elevation (NSTEMI) myocardial infarction: Secondary | ICD-10-CM | POA: Diagnosis not present

## 2019-04-29 DIAGNOSIS — A419 Sepsis, unspecified organism: Secondary | ICD-10-CM | POA: Diagnosis not present

## 2019-04-29 DIAGNOSIS — R0609 Other forms of dyspnea: Secondary | ICD-10-CM | POA: Diagnosis not present

## 2019-04-29 DIAGNOSIS — E233 Hypothalamic dysfunction, not elsewhere classified: Secondary | ICD-10-CM | POA: Diagnosis not present

## 2019-04-29 DIAGNOSIS — E119 Type 2 diabetes mellitus without complications: Secondary | ICD-10-CM | POA: Diagnosis not present

## 2019-04-29 DIAGNOSIS — I509 Heart failure, unspecified: Secondary | ICD-10-CM | POA: Diagnosis not present

## 2019-04-29 DIAGNOSIS — R0602 Shortness of breath: Secondary | ICD-10-CM | POA: Diagnosis not present

## 2019-04-29 DIAGNOSIS — E1165 Type 2 diabetes mellitus with hyperglycemia: Secondary | ICD-10-CM | POA: Diagnosis not present

## 2019-04-29 DIAGNOSIS — Z9119 Patient's noncompliance with other medical treatment and regimen: Secondary | ICD-10-CM | POA: Diagnosis not present

## 2019-04-29 DIAGNOSIS — I2510 Atherosclerotic heart disease of native coronary artery without angina pectoris: Secondary | ICD-10-CM | POA: Diagnosis not present

## 2019-04-29 DIAGNOSIS — I10 Essential (primary) hypertension: Secondary | ICD-10-CM | POA: Diagnosis not present

## 2019-04-29 DIAGNOSIS — F410 Panic disorder [episodic paroxysmal anxiety] without agoraphobia: Secondary | ICD-10-CM | POA: Diagnosis not present

## 2019-04-29 DIAGNOSIS — Z9989 Dependence on other enabling machines and devices: Secondary | ICD-10-CM | POA: Diagnosis not present

## 2019-04-29 DIAGNOSIS — Z833 Family history of diabetes mellitus: Secondary | ICD-10-CM | POA: Diagnosis not present

## 2019-04-29 DIAGNOSIS — J90 Pleural effusion, not elsewhere classified: Secondary | ICD-10-CM | POA: Diagnosis not present

## 2019-04-29 DIAGNOSIS — I5022 Chronic systolic (congestive) heart failure: Secondary | ICD-10-CM | POA: Diagnosis not present

## 2019-04-29 DIAGNOSIS — E7800 Pure hypercholesterolemia, unspecified: Secondary | ICD-10-CM | POA: Diagnosis not present

## 2019-04-29 DIAGNOSIS — I110 Hypertensive heart disease with heart failure: Secondary | ICD-10-CM | POA: Diagnosis not present

## 2019-04-30 DIAGNOSIS — R7989 Other specified abnormal findings of blood chemistry: Secondary | ICD-10-CM | POA: Diagnosis not present

## 2019-04-30 DIAGNOSIS — R0602 Shortness of breath: Secondary | ICD-10-CM | POA: Diagnosis not present

## 2019-04-30 DIAGNOSIS — I5023 Acute on chronic systolic (congestive) heart failure: Secondary | ICD-10-CM | POA: Diagnosis not present

## 2019-04-30 DIAGNOSIS — R072 Precordial pain: Secondary | ICD-10-CM | POA: Diagnosis not present

## 2019-04-30 DIAGNOSIS — R079 Chest pain, unspecified: Secondary | ICD-10-CM | POA: Diagnosis not present

## 2019-05-25 DIAGNOSIS — I509 Heart failure, unspecified: Secondary | ICD-10-CM | POA: Insufficient documentation

## 2019-05-25 DIAGNOSIS — I2510 Atherosclerotic heart disease of native coronary artery without angina pectoris: Secondary | ICD-10-CM | POA: Insufficient documentation

## 2019-05-25 DIAGNOSIS — I420 Dilated cardiomyopathy: Secondary | ICD-10-CM | POA: Insufficient documentation

## 2019-05-26 DIAGNOSIS — G4733 Obstructive sleep apnea (adult) (pediatric): Secondary | ICD-10-CM | POA: Insufficient documentation

## 2019-05-26 DIAGNOSIS — I5189 Other ill-defined heart diseases: Secondary | ICD-10-CM | POA: Diagnosis not present

## 2019-05-26 DIAGNOSIS — I2510 Atherosclerotic heart disease of native coronary artery without angina pectoris: Secondary | ICD-10-CM | POA: Diagnosis not present

## 2019-05-26 DIAGNOSIS — I509 Heart failure, unspecified: Secondary | ICD-10-CM | POA: Diagnosis not present

## 2019-05-26 DIAGNOSIS — Z9989 Dependence on other enabling machines and devices: Secondary | ICD-10-CM | POA: Insufficient documentation

## 2019-05-26 DIAGNOSIS — I110 Hypertensive heart disease with heart failure: Secondary | ICD-10-CM | POA: Diagnosis not present

## 2019-05-27 ENCOUNTER — Other Ambulatory Visit: Payer: Self-pay

## 2019-05-27 NOTE — Patient Outreach (Signed)
[facility]) Care Management  05/27/2019  Scott Anderson Mar 05, 1966 [id number]   Prisma referral received from [place] on 05/26/19 to outreach patient for financial resources. Successful outreach to patient today.  Per patient, he and spouse struggle financially due to being on fixed income/disability and having a lot of medical issues. He and spouse have never applied for Medicaid.  He reports that they occasionally use a local pantry as food resource.  Sending the following to patient: Medicaid Application Application for Food and [place] List [employer] ([employer] [place] [facility], [place], [employer], [place], [place] [address]) Will follow up with patient within the next two weeks to ensure receipt of resources.  Ronn Melena, BSW Social Worker [phone]

## 2019-06-10 ENCOUNTER — Other Ambulatory Visit: Payer: Self-pay

## 2019-06-10 NOTE — Patient Outreach (Signed)
[place] [facility]) Care Management  06/10/2019  Scott Anderson 1965/12/02 [id number]   Successful follow up call to patient today, however, he did not receive resources mailed on 05/27/19.  Sending the following again today: Medicaid Application Application for Food and [place] List [employer] ([employer] [place] [facility], [place], [employer], [place], [place] [address]) Will follow up with patient within the next two weeks to ensure receipt of resources.  Ronn Melena, BSW Social Worker [phone]

## 2019-06-24 ENCOUNTER — Other Ambulatory Visit: Payer: Self-pay

## 2019-06-24 ENCOUNTER — Ambulatory Visit: Payer: Self-pay

## 2019-06-24 NOTE — Patient Outreach (Addendum)
[facility]) Care Management  06/24/2019  REVIS WHALIN [birth year] [id number]   Attempted to contact patient to ensure receipt of resources that were mailed for a second time on 06/10/19.  No answer or option to leave message at home or mobile numbers.  Will attempt to reach again within four business days.   Addendum: Return call from patient.  He confirmed receipt of resources.  Closing case at this time but did encourage him to call if additional needs arise.  Ronn Melena, BSW Social Worker [phone]

## 2019-06-28 ENCOUNTER — Ambulatory Visit: Payer: Self-pay

## 2019-07-19 ENCOUNTER — Other Ambulatory Visit: Payer: Self-pay | Admitting: *Deleted

## 2019-08-19 DIAGNOSIS — Z03818 Encounter for observation for suspected exposure to other biological agents ruled out: Secondary | ICD-10-CM | POA: Diagnosis not present

## 2019-12-15 DIAGNOSIS — G8918 Other acute postprocedural pain: Secondary | ICD-10-CM | POA: Diagnosis not present

## 2019-12-15 DIAGNOSIS — Z452 Encounter for adjustment and management of vascular access device: Secondary | ICD-10-CM | POA: Diagnosis not present

## 2019-12-15 DIAGNOSIS — B9689 Other specified bacterial agents as the cause of diseases classified elsewhere: Secondary | ICD-10-CM | POA: Diagnosis not present

## 2019-12-15 DIAGNOSIS — E1165 Type 2 diabetes mellitus with hyperglycemia: Secondary | ICD-10-CM | POA: Diagnosis not present

## 2019-12-15 DIAGNOSIS — M1990 Unspecified osteoarthritis, unspecified site: Secondary | ICD-10-CM | POA: Diagnosis not present

## 2019-12-15 DIAGNOSIS — B951 Streptococcus, group B, as the cause of diseases classified elsewhere: Secondary | ICD-10-CM | POA: Diagnosis not present

## 2019-12-15 DIAGNOSIS — I110 Hypertensive heart disease with heart failure: Secondary | ICD-10-CM | POA: Diagnosis not present

## 2019-12-15 DIAGNOSIS — M79661 Pain in right lower leg: Secondary | ICD-10-CM | POA: Diagnosis not present

## 2019-12-15 DIAGNOSIS — I509 Heart failure, unspecified: Secondary | ICD-10-CM | POA: Diagnosis not present

## 2019-12-15 DIAGNOSIS — E11628 Type 2 diabetes mellitus with other skin complications: Secondary | ICD-10-CM | POA: Diagnosis not present

## 2019-12-15 DIAGNOSIS — L02415 Cutaneous abscess of right lower limb: Secondary | ICD-10-CM | POA: Diagnosis not present

## 2019-12-15 DIAGNOSIS — A419 Sepsis, unspecified organism: Secondary | ICD-10-CM | POA: Diagnosis not present

## 2019-12-15 DIAGNOSIS — E669 Obesity, unspecified: Secondary | ICD-10-CM | POA: Diagnosis not present

## 2019-12-15 DIAGNOSIS — R000 Tachycardia, unspecified: Secondary | ICD-10-CM | POA: Diagnosis not present

## 2019-12-15 DIAGNOSIS — E1152 Type 2 diabetes mellitus with diabetic peripheral angiopathy with gangrene: Secondary | ICD-10-CM | POA: Diagnosis not present

## 2019-12-15 DIAGNOSIS — I96 Gangrene, not elsewhere classified: Secondary | ICD-10-CM | POA: Diagnosis not present

## 2019-12-15 DIAGNOSIS — Z79891 Long term (current) use of opiate analgesic: Secondary | ICD-10-CM | POA: Diagnosis not present

## 2019-12-15 DIAGNOSIS — D72829 Elevated white blood cell count, unspecified: Secondary | ICD-10-CM | POA: Diagnosis not present

## 2019-12-15 DIAGNOSIS — E871 Hypo-osmolality and hyponatremia: Secondary | ICD-10-CM | POA: Diagnosis not present

## 2019-12-15 DIAGNOSIS — S91301A Unspecified open wound, right foot, initial encounter: Secondary | ICD-10-CM | POA: Diagnosis not present

## 2019-12-15 DIAGNOSIS — Z7982 Long term (current) use of aspirin: Secondary | ICD-10-CM | POA: Diagnosis not present

## 2019-12-15 DIAGNOSIS — L03115 Cellulitis of right lower limb: Secondary | ICD-10-CM | POA: Diagnosis not present

## 2019-12-15 DIAGNOSIS — I10 Essential (primary) hypertension: Secondary | ICD-10-CM | POA: Diagnosis not present

## 2019-12-15 DIAGNOSIS — Z794 Long term (current) use of insulin: Secondary | ICD-10-CM | POA: Diagnosis not present

## 2019-12-15 DIAGNOSIS — Z6841 Body Mass Index (BMI) 40.0 and over, adult: Secondary | ICD-10-CM | POA: Diagnosis not present

## 2019-12-15 DIAGNOSIS — Z79899 Other long term (current) drug therapy: Secondary | ICD-10-CM | POA: Diagnosis not present

## 2019-12-15 DIAGNOSIS — R778 Other specified abnormalities of plasma proteins: Secondary | ICD-10-CM | POA: Diagnosis not present

## 2019-12-15 DIAGNOSIS — G629 Polyneuropathy, unspecified: Secondary | ICD-10-CM | POA: Diagnosis not present

## 2019-12-15 DIAGNOSIS — S91001A Unspecified open wound, right ankle, initial encounter: Secondary | ICD-10-CM | POA: Diagnosis not present

## 2019-12-15 DIAGNOSIS — N179 Acute kidney failure, unspecified: Secondary | ICD-10-CM | POA: Diagnosis not present

## 2019-12-15 DIAGNOSIS — M7989 Other specified soft tissue disorders: Secondary | ICD-10-CM | POA: Diagnosis not present

## 2019-12-16 DIAGNOSIS — D72829 Elevated white blood cell count, unspecified: Secondary | ICD-10-CM | POA: Diagnosis not present

## 2019-12-16 DIAGNOSIS — I10 Essential (primary) hypertension: Secondary | ICD-10-CM | POA: Diagnosis not present

## 2019-12-16 DIAGNOSIS — L03115 Cellulitis of right lower limb: Secondary | ICD-10-CM | POA: Diagnosis not present

## 2019-12-16 DIAGNOSIS — L02415 Cutaneous abscess of right lower limb: Secondary | ICD-10-CM

## 2019-12-16 DIAGNOSIS — E1165 Type 2 diabetes mellitus with hyperglycemia: Secondary | ICD-10-CM | POA: Diagnosis not present

## 2019-12-17 DIAGNOSIS — E1165 Type 2 diabetes mellitus with hyperglycemia: Secondary | ICD-10-CM | POA: Diagnosis not present

## 2019-12-17 DIAGNOSIS — I10 Essential (primary) hypertension: Secondary | ICD-10-CM | POA: Diagnosis not present

## 2019-12-17 DIAGNOSIS — L02415 Cutaneous abscess of right lower limb: Secondary | ICD-10-CM

## 2019-12-17 DIAGNOSIS — D72829 Elevated white blood cell count, unspecified: Secondary | ICD-10-CM | POA: Diagnosis not present

## 2019-12-17 DIAGNOSIS — L03115 Cellulitis of right lower limb: Secondary | ICD-10-CM

## 2019-12-18 DIAGNOSIS — L02415 Cutaneous abscess of right lower limb: Secondary | ICD-10-CM | POA: Diagnosis not present

## 2019-12-18 DIAGNOSIS — E1165 Type 2 diabetes mellitus with hyperglycemia: Secondary | ICD-10-CM | POA: Diagnosis not present

## 2019-12-18 DIAGNOSIS — I10 Essential (primary) hypertension: Secondary | ICD-10-CM | POA: Diagnosis not present

## 2019-12-18 DIAGNOSIS — D72829 Elevated white blood cell count, unspecified: Secondary | ICD-10-CM | POA: Diagnosis not present

## 2019-12-19 DIAGNOSIS — D72829 Elevated white blood cell count, unspecified: Secondary | ICD-10-CM | POA: Diagnosis not present

## 2019-12-19 DIAGNOSIS — L02415 Cutaneous abscess of right lower limb: Secondary | ICD-10-CM | POA: Diagnosis not present

## 2019-12-19 DIAGNOSIS — I10 Essential (primary) hypertension: Secondary | ICD-10-CM | POA: Diagnosis not present

## 2019-12-19 DIAGNOSIS — L03115 Cellulitis of right lower limb: Secondary | ICD-10-CM

## 2019-12-19 DIAGNOSIS — E1165 Type 2 diabetes mellitus with hyperglycemia: Secondary | ICD-10-CM | POA: Diagnosis not present

## 2019-12-20 DIAGNOSIS — L02415 Cutaneous abscess of right lower limb: Secondary | ICD-10-CM | POA: Diagnosis not present

## 2019-12-20 DIAGNOSIS — D72829 Elevated white blood cell count, unspecified: Secondary | ICD-10-CM | POA: Diagnosis not present

## 2019-12-20 DIAGNOSIS — E1165 Type 2 diabetes mellitus with hyperglycemia: Secondary | ICD-10-CM | POA: Diagnosis not present

## 2019-12-20 DIAGNOSIS — I10 Essential (primary) hypertension: Secondary | ICD-10-CM | POA: Diagnosis not present

## 2019-12-21 DIAGNOSIS — E1165 Type 2 diabetes mellitus with hyperglycemia: Secondary | ICD-10-CM | POA: Diagnosis not present

## 2019-12-21 DIAGNOSIS — I10 Essential (primary) hypertension: Secondary | ICD-10-CM | POA: Diagnosis not present

## 2019-12-21 DIAGNOSIS — L03115 Cellulitis of right lower limb: Secondary | ICD-10-CM

## 2019-12-21 DIAGNOSIS — D72829 Elevated white blood cell count, unspecified: Secondary | ICD-10-CM | POA: Diagnosis not present

## 2019-12-21 DIAGNOSIS — L02415 Cutaneous abscess of right lower limb: Secondary | ICD-10-CM | POA: Diagnosis not present

## 2019-12-22 ENCOUNTER — Telehealth: Payer: Self-pay

## 2019-12-22 DIAGNOSIS — Z79890 Hormone replacement therapy (postmenopausal): Secondary | ICD-10-CM | POA: Diagnosis not present

## 2019-12-22 DIAGNOSIS — L02415 Cutaneous abscess of right lower limb: Secondary | ICD-10-CM | POA: Diagnosis not present

## 2019-12-22 DIAGNOSIS — Z7982 Long term (current) use of aspirin: Secondary | ICD-10-CM | POA: Diagnosis not present

## 2019-12-22 DIAGNOSIS — E1142 Type 2 diabetes mellitus with diabetic polyneuropathy: Secondary | ICD-10-CM | POA: Diagnosis not present

## 2019-12-22 DIAGNOSIS — E669 Obesity, unspecified: Secondary | ICD-10-CM | POA: Diagnosis not present

## 2019-12-22 DIAGNOSIS — F419 Anxiety disorder, unspecified: Secondary | ICD-10-CM | POA: Diagnosis not present

## 2019-12-22 DIAGNOSIS — E871 Hypo-osmolality and hyponatremia: Secondary | ICD-10-CM | POA: Diagnosis not present

## 2019-12-22 DIAGNOSIS — N179 Acute kidney failure, unspecified: Secondary | ICD-10-CM | POA: Diagnosis not present

## 2019-12-22 DIAGNOSIS — E1165 Type 2 diabetes mellitus with hyperglycemia: Secondary | ICD-10-CM | POA: Diagnosis not present

## 2019-12-22 DIAGNOSIS — L02611 Cutaneous abscess of right foot: Secondary | ICD-10-CM | POA: Diagnosis not present

## 2019-12-22 DIAGNOSIS — B951 Streptococcus, group B, as the cause of diseases classified elsewhere: Secondary | ICD-10-CM | POA: Diagnosis not present

## 2019-12-22 DIAGNOSIS — E7800 Pure hypercholesterolemia, unspecified: Secondary | ICD-10-CM | POA: Diagnosis not present

## 2019-12-22 DIAGNOSIS — Z8631 Personal history of diabetic foot ulcer: Secondary | ICD-10-CM | POA: Diagnosis not present

## 2019-12-22 DIAGNOSIS — E039 Hypothyroidism, unspecified: Secondary | ICD-10-CM | POA: Diagnosis not present

## 2019-12-22 DIAGNOSIS — Z452 Encounter for adjustment and management of vascular access device: Secondary | ICD-10-CM | POA: Diagnosis not present

## 2019-12-22 DIAGNOSIS — F329 Major depressive disorder, single episode, unspecified: Secondary | ICD-10-CM | POA: Diagnosis not present

## 2019-12-22 DIAGNOSIS — Z792 Long term (current) use of antibiotics: Secondary | ICD-10-CM | POA: Diagnosis not present

## 2019-12-22 DIAGNOSIS — Z79891 Long term (current) use of opiate analgesic: Secondary | ICD-10-CM | POA: Diagnosis not present

## 2019-12-22 DIAGNOSIS — Z794 Long term (current) use of insulin: Secondary | ICD-10-CM | POA: Diagnosis not present

## 2019-12-22 DIAGNOSIS — D72829 Elevated white blood cell count, unspecified: Secondary | ICD-10-CM | POA: Diagnosis not present

## 2019-12-22 DIAGNOSIS — I110 Hypertensive heart disease with heart failure: Secondary | ICD-10-CM | POA: Diagnosis not present

## 2019-12-22 DIAGNOSIS — L03115 Cellulitis of right lower limb: Secondary | ICD-10-CM | POA: Diagnosis not present

## 2019-12-22 DIAGNOSIS — Z791 Long term (current) use of non-steroidal anti-inflammatories (NSAID): Secondary | ICD-10-CM | POA: Diagnosis not present

## 2019-12-22 DIAGNOSIS — M1991 Primary osteoarthritis, unspecified site: Secondary | ICD-10-CM | POA: Diagnosis not present

## 2019-12-22 DIAGNOSIS — I509 Heart failure, unspecified: Secondary | ICD-10-CM | POA: Diagnosis not present

## 2019-12-22 NOTE — Telephone Encounter (Signed)
Apply silver alginate to Right medial ankle wound and dry dressing 3x per week Labs can be drawn on next week, Preferably on a Monday or Tuesday so that way I can review them when I come to [place] office on Wednesdays via fax Rx CBC, Chem 7, ESR, Sed Rate once weekly for the next 2 weeks or until we have finished the picc line antibiotics -Dr. Cannon Kettle

## 2019-12-22 NOTE — Telephone Encounter (Signed)
Contacted [name] nurse from [facility] and informed her of the Dr's wound orders and lab orders. Scott Anderson stated understanding

## 2019-12-22 NOTE — Telephone Encounter (Signed)
Freida Busman nurse from [facility] called stating they had received wound orders for Scott Anderson. Freida Busman wants to verify dressing change frequency, and when would you like the labs to be done, next week or this week since the pt just got discharged of the hospital. Please advice

## 2019-12-26 ENCOUNTER — Encounter: Payer: Self-pay | Admitting: Sports Medicine

## 2019-12-26 DIAGNOSIS — Z5181 Encounter for therapeutic drug level monitoring: Secondary | ICD-10-CM | POA: Diagnosis not present

## 2019-12-29 ENCOUNTER — Encounter: Payer: Self-pay | Admitting: Sports Medicine

## 2019-12-29 ENCOUNTER — Ambulatory Visit (INDEPENDENT_AMBULATORY_CARE_PROVIDER_SITE_OTHER): Payer: PPO | Admitting: Sports Medicine

## 2019-12-29 ENCOUNTER — Other Ambulatory Visit: Payer: Self-pay

## 2019-12-29 DIAGNOSIS — I739 Peripheral vascular disease, unspecified: Secondary | ICD-10-CM

## 2019-12-29 DIAGNOSIS — L97312 Non-pressure chronic ulcer of right ankle with fat layer exposed: Secondary | ICD-10-CM | POA: Diagnosis not present

## 2019-12-29 DIAGNOSIS — Z9889 Other specified postprocedural states: Secondary | ICD-10-CM

## 2019-12-29 DIAGNOSIS — L02415 Cutaneous abscess of right lower limb: Secondary | ICD-10-CM

## 2019-12-29 DIAGNOSIS — L03115 Cellulitis of right lower limb: Secondary | ICD-10-CM

## 2019-12-29 DIAGNOSIS — E1142 Type 2 diabetes mellitus with diabetic polyneuropathy: Secondary | ICD-10-CM

## 2019-12-29 MED ORDER — OXYCODONE HCL 5 MG PO TABS: 5.0000 mg | ORAL_TABLET | Freq: Four times a day (QID) | ORAL | 0 refills | Status: AC | PRN

## 2019-12-29 NOTE — Progress Notes (Signed)
Subjective: Scott Anderson is a 54 y.o. male patient seen today in office for POV #1 (DOS 12-16-19), S/P R ankle I&D and wound debridement. Patient admits some pain at surgical site, denies calf pain, denies headache, chest pain, shortness of breath, nausea, vomiting, fever, or chills. Patient states that he is glad to be home. Has home health changing dressings and is doing PICC line antibiotics but is awaiting shipment for next week. No other issues noted.   Patient Active Problem List   Diagnosis Date Noted  . Pituitary insufficiency (HCC) 12/11/2015  . Hypogonadism male 11/29/2015  . Erectile dysfunction 11/26/2015  . Morbid obesity (HCC) 11/26/2015  . Diabetes (HCC) 11/26/2015  . HTN (hypertension) 11/26/2015  . Hypothyroidism 11/26/2015  . Anxiety state 11/26/2015  . Hypertriglyceridemia 11/26/2015    Current Outpatient Medications on File Prior to Visit  Medication Sig Dispense Refill  . clomiPHENE (CLOMID) 50 MG tablet Take 0.5 tablets (25 mg total) by mouth daily. 10 tablet 0  . diazepam (VALIUM) 5 MG tablet TK 1 T PO  TID PRN  0  . fenofibrate 160 MG tablet TK 1 T PO D  3  . gabapentin (NEURONTIN) 300 MG capsule 3 (three) times daily.   1  . gemfibrozil (LOPID) 600 MG tablet Take by mouth.    . Insulin Glargine (TOUJEO SOLOSTAR) 300 UNIT/ML SOPN Inject 500 Units into the skin every morning. And pen needles 2/day 21 pen 11  . Insulin Syringe-Needle U-100 (B-D INS SYRINGE 2CC/29GX1/2") 29G X 1/2" 2 ML MISC Used for daily insulin injections 2x. 200 each 5  . levothyroxine (SYNTHROID, LEVOTHROID) 75 MCG tablet Take 1 tablet (75 mcg total) by mouth daily. 90 tablet 3  . lisinopril (PRINIVIL,ZESTRIL) 40 MG tablet TK 1 T PO D  5  . metFORMIN (GLUCOPHAGE) 1000 MG tablet Take 1,000 mg by mouth 2 (two) times daily with a meal.    . metoprolol tartrate (LOPRESSOR) 25 MG tablet   4  . Omega-3 1000 MG CAPS Take by mouth.    . potassium chloride (K-DUR,KLOR-CON) 10 MEQ tablet Take 10 mEq by  mouth 2 (two) times daily. Reported on 11/26/2015    . PROAIR HFA 108 (90 Base) MCG/ACT inhaler INL 2 PFS PO Q 6 H PRN  5  . sildenafil (REVATIO) 20 MG tablet 2-5 tabs, as needed for ED symptoms 50 tablet 11   No current facility-administered medications on file prior to visit.    No Known Allergies  Objective: There were no vitals filed for this visit.  General: No acute distress, AAOx3  Right foot: I&D/Medial ankle ulceration with a mix of fibrogranular tissue that measures 7x9cm  With rolled margins, lateral ankle ulcer measures  4x2x0.5 with joint capsule exposure, decreased cellulitis, decreased warmth, bloody drainage, Capillary fill time <3 seconds in all digits, gross sensation present via light touch to right foot. Protective sensation absent. Decreased pain to calf.  No pain with calf compression.   Assessment and Plan:  Problem List Items Addressed This Visit    None    Visit Diagnoses    Post-operative state    -  Primary   Relevant Medications   oxyCODONE (ROXICODONE) 5 MG immediate release tablet   Ankle ulcer, right, with fat layer exposed (HCC)       PVD (peripheral vascular disease) (HCC)       Diabetic polyneuropathy associated with type 2 diabetes mellitus (HCC)       Cellulitis and abscess of right leg           -  Patient seen and evaluated -Cleansed ulcerations and using a sterile nipper debrided ulcerations to healthy bleeding without discomfort. Post measurements as above. Applied PRISMA and dry sterile dressing to surgical site right foot secured with ACE wrap and stockinet  -Advised patient to make sure to keep dressings clean, dry, and intact to right surgical site; dressings to be changed 2x weekly by home health using Durafiber, discussed this with home nurse Keri -Continue Picc line antibiotics should finish on 5/8 -Advised patient to continue with post-op shoe on right and rolling walker -Advised patient to limit activity to necessity  -Advised patient  to ice and elevate as necessary  -Refilled oxycodone for pain  -Will plan for continued wound care at next office visit. In the meantime, patient to call office if any issues or problems arise.   Landis Martins, DPM

## 2019-12-31 DIAGNOSIS — L02415 Cutaneous abscess of right lower limb: Secondary | ICD-10-CM | POA: Diagnosis not present

## 2020-01-02 ENCOUNTER — Encounter: Payer: Self-pay | Admitting: Sports Medicine

## 2020-01-02 DIAGNOSIS — Z5181 Encounter for therapeutic drug level monitoring: Secondary | ICD-10-CM | POA: Diagnosis not present

## 2020-01-04 ENCOUNTER — Telehealth: Payer: Self-pay

## 2020-01-04 NOTE — Telephone Encounter (Signed)
Scott Anderson from [facility] was advised to keep Pt's Picc line in place. Scott Anderson also mentioned that they are seeing Pt Monday, Wednesday and Friday and wanted to know if Dr. Marylene Land wanted to change the frequency since pt is seen every Thursdays. Please advice

## 2020-01-04 NOTE — Telephone Encounter (Signed)
Keep in place. I will see him this week to see if we need to resume antibiotics or if it can be removed pending the appearance of his foot/leg when I see him -Dr. Kathie Rhodes

## 2020-01-04 NOTE — Telephone Encounter (Signed)
[facility] is still very red and would like to know if Dr. Marylene Land wants the Picc line to stay in place until the pt sees Dr. Marylene Land or go ahead and pull it out. Please advice

## 2020-01-04 NOTE — Telephone Encounter (Signed)
Yes they can change the schedule to twice a week since I will be seeing him on Thursdays

## 2020-01-05 NOTE — Telephone Encounter (Signed)
They can go on Mondays and he will see [place] in office on Thursdays

## 2020-01-05 NOTE — Telephone Encounter (Signed)
Birdie Sons that per Dr. Marylene Land they can do wound changes on Mondays and we will se pt on Thursdays

## 2020-01-05 NOTE — Telephone Encounter (Signed)
Scott Anderson was advised that per Dr. Marylene Land they can do home care visits twice a week. Scott Anderson states she wanted to specified her question and ask Dr. Marylene Land exacty what days she is requiring for them to go and do the dressing changes? Or if they can go only on Mondays and let Dr. Marylene Land change it on Thursdays since the pt stated that he doesn't see the need for them to go many times.

## 2020-01-06 ENCOUNTER — Ambulatory Visit (INDEPENDENT_AMBULATORY_CARE_PROVIDER_SITE_OTHER): Payer: PPO | Admitting: Sports Medicine

## 2020-01-06 ENCOUNTER — Encounter: Payer: Self-pay | Admitting: Sports Medicine

## 2020-01-06 ENCOUNTER — Other Ambulatory Visit: Payer: Self-pay

## 2020-01-06 DIAGNOSIS — L03115 Cellulitis of right lower limb: Secondary | ICD-10-CM

## 2020-01-06 DIAGNOSIS — L02415 Cutaneous abscess of right lower limb: Secondary | ICD-10-CM

## 2020-01-06 DIAGNOSIS — I739 Peripheral vascular disease, unspecified: Secondary | ICD-10-CM

## 2020-01-06 DIAGNOSIS — Z9889 Other specified postprocedural states: Secondary | ICD-10-CM

## 2020-01-06 DIAGNOSIS — E1142 Type 2 diabetes mellitus with diabetic polyneuropathy: Secondary | ICD-10-CM

## 2020-01-06 DIAGNOSIS — L97312 Non-pressure chronic ulcer of right ankle with fat layer exposed: Secondary | ICD-10-CM

## 2020-01-06 MED ORDER — OXYCODONE HCL 5 MG PO TABS: 5.0000 mg | ORAL_TABLET | Freq: Four times a day (QID) | ORAL | 0 refills | Status: DC | PRN

## 2020-01-06 MED ORDER — SULFAMETHOXAZOLE-TRIMETHOPRIM 400-80 MG PO TABS: 1.0000 | ORAL_TABLET | Freq: Two times a day (BID) | ORAL | 0 refills | Status: DC

## 2020-01-06 NOTE — Progress Notes (Signed)
Subjective: Scott Anderson is a 54 y.o. male patient seen today in office for POV #2 (DOS 12-16-19), S/P R ankle I&D and wound debridement. Patient admits some pain at surgical site that is slowly getting better, denies calf pain but leg pain is getting better, denies headache, chest pain, shortness of breath, nausea, vomiting, fever, or chills. Patient states that he has finished his PICC line antibiotics and nurse is scheduled to come out today to check him. No other issues noted.   Patient Active Problem List   Diagnosis Date Noted  . Pituitary insufficiency ([place]) 12/11/2015  . Hypogonadism male 11/29/2015  . Erectile dysfunction 11/26/2015  . Morbid obesity ([place]) 11/26/2015  . Diabetes ([place]) 11/26/2015  . HTN (hypertension) 11/26/2015  . Hypothyroidism 11/26/2015  . Anxiety state 11/26/2015  . Hypertriglyceridemia 11/26/2015    Current Outpatient Medications on File Prior to Visit  Medication Sig Dispense Refill  . clomiPHENE (CLOMID) 50 MG tablet Take 0.5 tablets (25 mg total) by mouth daily. 10 tablet 0  . diazepam (VALIUM) 5 MG tablet TK 1 T PO  TID PRN  0  . fenofibrate 160 MG tablet TK 1 T PO D  3  . gabapentin (NEURONTIN) 300 MG capsule 3 (three) times daily.   1  . gemfibrozil (LOPID) 600 MG tablet Take by mouth.    . Insulin Glargine (TOUJEO SOLOSTAR) 300 UNIT/ML SOPN Inject 500 Units into the skin every morning. And pen needles 2/day 21 pen 11  . Insulin Syringe-Needle U-100 (B-D INS SYRINGE 2CC/29GX1/2") 29G X 1/2" 2 ML MISC Used for daily insulin injections 2x. 200 each 5  . levothyroxine (SYNTHROID, LEVOTHROID) 75 MCG tablet Take 1 tablet (75 mcg total) by mouth daily. 90 tablet 3  . lisinopril (PRINIVIL,ZESTRIL) 40 MG tablet TK 1 T PO D  5  . metFORMIN (GLUCOPHAGE) 1000 MG tablet Take 1,000 mg by mouth 2 (two) times daily with a meal.    . metoprolol tartrate (LOPRESSOR) 25 MG tablet   4  . Omega-3 1000 MG CAPS Take by mouth.    . potassium chloride (K-DUR,KLOR-CON) 10  MEQ tablet Take 10 mEq by mouth 2 (two) times daily. Reported on 11/26/2015    . PROAIR HFA 108 (90 Base) MCG/ACT inhaler INL 2 PFS PO Q 6 H PRN  5  . sildenafil (REVATIO) 20 MG tablet 2-5 tabs, as needed for ED symptoms 50 tablet 11   No current facility-administered medications on file prior to visit.    No Known Allergies  Objective: There were no vitals filed for this visit.  General: No acute distress, AAOx3  Right foot: I&D/Medial ankle ulceration with a mix of fibrogranular tissue and central blood clot that measures 6.5x9cm with rolled margins, lateral ankle ulcer measures  4x2x0.5 with joint capsule exposure that is slowly looking a little better, decreased cellulitis, decreased warmth, bloody drainage, no malodor, Capillary fill time <3 seconds in all digits, gross sensation present via light touch to right foot. Protective sensation absent. Decreased pain to leg, no pain to calf.  No pain with calf compression.   Assessment and Plan:  Problem List Items Addressed This Visit    None    Visit Diagnoses    Ankle ulcer, right, with fat layer exposed ([place])    -  Primary   Post-operative state       Relevant Medications   sulfamethoxazole-trimethoprim (BACTRIM) 400-80 MG tablet   oxyCODONE (OXY IR/ROXICODONE) 5 MG immediate release tablet   PVD (peripheral vascular  disease) (HCC)       Diabetic polyneuropathy associated with type 2 diabetes mellitus (HCC)       Cellulitis and abscess of right leg           -Patient seen and evaluated -Cleansed ulcerations. Applied PRISMA and dry sterile dressing to surgical site right foot secured with ACE wrap and stockinet  -Advised patient to make sure to keep dressings clean, dry, and intact to right surgical site; dressings to be changed weekly by home health using Durafiber or silver alginate -PICC line may be removed -Rx Bactrim for preventative measures -Advised patient to continue with post-op shoe on right and rolling  walker -Advised patient to limit activity to necessity  -Advised patient to ice and elevate as necessary  -Refilled oxycodone for pain  -Will plan for continued wound care at next office visit. In the meantime, patient to call office if any issues or problems arise.   [name], DPM

## 2020-01-09 ENCOUNTER — Telehealth: Payer: Self-pay | Admitting: *Deleted

## 2020-01-09 ENCOUNTER — Telehealth: Payer: Self-pay

## 2020-01-09 NOTE — Telephone Encounter (Signed)
Faxed Dr. Wynema Birch 01/06/2020 6:22pm orders to [facility]

## 2020-01-09 NOTE — Telephone Encounter (Signed)
-----   Message from [place], [place] sent at 01/06/2020  6:22 PM EDT ----- Regarding: Home nursing orders Remove PICC line  Continue with once weekly dressing changes using silver alginate to the areas and compression wraps to the level of the knee

## 2020-01-09 NOTE — Telephone Encounter (Signed)
Alice-RN from [facility] called stating/reporting pt had a fall today w/o injury. Alice states pt fell into the couch and not on the floor

## 2020-01-10 NOTE — Telephone Encounter (Signed)
Ok thanks for letting me know. I will discuss this fall with him when he comes to office to see if there are any other ambulatory needs. -Dr. Marylene Land

## 2020-01-12 ENCOUNTER — Ambulatory Visit (INDEPENDENT_AMBULATORY_CARE_PROVIDER_SITE_OTHER): Payer: PPO | Admitting: Sports Medicine

## 2020-01-12 ENCOUNTER — Encounter: Payer: Self-pay | Admitting: Sports Medicine

## 2020-01-12 ENCOUNTER — Other Ambulatory Visit: Payer: Self-pay

## 2020-01-12 DIAGNOSIS — I739 Peripheral vascular disease, unspecified: Secondary | ICD-10-CM

## 2020-01-12 DIAGNOSIS — L02415 Cutaneous abscess of right lower limb: Secondary | ICD-10-CM

## 2020-01-12 DIAGNOSIS — Z9889 Other specified postprocedural states: Secondary | ICD-10-CM

## 2020-01-12 DIAGNOSIS — E1142 Type 2 diabetes mellitus with diabetic polyneuropathy: Secondary | ICD-10-CM

## 2020-01-12 DIAGNOSIS — L97312 Non-pressure chronic ulcer of right ankle with fat layer exposed: Secondary | ICD-10-CM

## 2020-01-12 DIAGNOSIS — L03115 Cellulitis of right lower limb: Secondary | ICD-10-CM

## 2020-01-12 MED ORDER — SULFAMETHOXAZOLE-TRIMETHOPRIM 400-80 MG PO TABS: 1.0000 | ORAL_TABLET | Freq: Two times a day (BID) | ORAL | 0 refills | Status: DC

## 2020-01-12 MED ORDER — OXYCODONE HCL 5 MG PO TABS: 5.0000 mg | ORAL_TABLET | Freq: Four times a day (QID) | ORAL | 0 refills | Status: AC | PRN

## 2020-01-12 NOTE — Progress Notes (Addendum)
Subjective: Scott Anderson is a 54 y.o. male patient seen today in office for POV #3 (DOS 12-16-19), S/P R ankle I&D and wound debridement. Patient admits some pain at surgical site that is slowly getting better, denies calf pain but leg pain is getting better like before, denies headache, chest pain, shortness of breath, nausea, vomiting, fever, or chills. Patient states that his PICC Line has been removed. Requests wheelchair, had a fall earlier this week. No other issues noted.   Patient Active Problem List   Diagnosis Date Noted  . Pituitary insufficiency ([place]) 12/11/2015  . Hypogonadism male 11/29/2015  . Erectile dysfunction 11/26/2015  . Morbid obesity ([place]) 11/26/2015  . Diabetes ([place]) 11/26/2015  . HTN (hypertension) 11/26/2015  . Hypothyroidism 11/26/2015  . Anxiety state 11/26/2015  . Hypertriglyceridemia 11/26/2015    Current Outpatient Medications on File Prior to Visit  Medication Sig Dispense Refill  . clomiPHENE (CLOMID) 50 MG tablet Take 0.5 tablets (25 mg total) by mouth daily. 10 tablet 0  . diazepam (VALIUM) 5 MG tablet TK 1 T PO  TID PRN  0  . fenofibrate 160 MG tablet TK 1 T PO D  3  . gabapentin (NEURONTIN) 300 MG capsule 3 (three) times daily.   1  . gemfibrozil (LOPID) 600 MG tablet Take by mouth.    . Insulin Glargine (TOUJEO SOLOSTAR) 300 UNIT/ML SOPN Inject 500 Units into the skin every morning. And pen needles 2/day 21 pen 11  . Insulin Syringe-Needle U-100 (B-D INS SYRINGE 2CC/29GX1/2") 29G X 1/2" 2 ML MISC Used for daily insulin injections 2x. 200 each 5  . levothyroxine (SYNTHROID, LEVOTHROID) 75 MCG tablet Take 1 tablet (75 mcg total) by mouth daily. 90 tablet 3  . lisinopril (PRINIVIL,ZESTRIL) 40 MG tablet TK 1 T PO D  5  . metFORMIN (GLUCOPHAGE) 1000 MG tablet Take 1,000 mg by mouth 2 (two) times daily with a meal.    . metoprolol tartrate (LOPRESSOR) 25 MG tablet   4  . Omega-3 1000 MG CAPS Take by mouth.    . potassium chloride (K-DUR,KLOR-CON) 10 MEQ  tablet Take 10 mEq by mouth 2 (two) times daily. Reported on 11/26/2015    . PROAIR HFA 108 (90 Base) MCG/ACT inhaler INL 2 PFS PO Q 6 H PRN  5  . sildenafil (REVATIO) 20 MG tablet 2-5 tabs, as needed for ED symptoms 50 tablet 11   No current facility-administered medications on file prior to visit.    No Known Allergies  Objective: There were no vitals filed for this visit.  General: No acute distress, AAOx3  Right foot: I&D/Medial ankle ulceration with a mix of fibrogranular tissue and central blood clot that measures 6.5x9cm with rolled margins, lateral ankle ulcer measures  4x2x0.5 with joint capsule exposure that is slowly looking a little better with more granular tissue noted, decreased cellulitis, decreased warmth, bloody drainage, no malodor, Capillary fill time <3 seconds in all digits, gross sensation present via light touch to right foot. Protective sensation absent. Decreased pain to leg, no pain to calf.  No pain with calf compression.   Assessment and Plan:  Problem List Items Addressed This Visit    None    Visit Diagnoses    Ankle ulcer, right, with fat layer exposed ([place])    -  Primary   Post-operative state       Relevant Medications   oxyCODONE (OXY IR/ROXICODONE) 5 MG immediate release tablet   sulfamethoxazole-trimethoprim (BACTRIM) 400-80 MG tablet  PVD (peripheral vascular disease) (HCC)       Diabetic polyneuropathy associated with type 2 diabetes mellitus (HCC)       Cellulitis and abscess of right leg          -Patient seen and evaluated -Cleansed ulcerations. Applied PRISMA and dry sterile dressing to surgical site right foot secured with ACE wrap and stockinet  -Advised patient to make sure to keep dressings clean, dry, and intact to right surgical site; dressings to be changed weekly by home health using Durafiber or silver alginate on Mondays  -May use foot miracle cream to dry skin areas but avoid open wounds -Refilled Bactrim for preventative  measures for one more round -Advised patient to continue with post-op shoe on right and rolling walker also will order a wheelchair since patient is having difficulty ambulating safety and has fell down in the last week, wheelchair is necessary to ensure safety with ambulation and patient has a medical condition/wound that make it difficult to walk distances longer than 20 feet -Advised patient to limit activity to necessity  -Advised patient to ice and elevate as necessary  -Refilled oxycodone for pain  -Will plan for continued wound care at next office visit. In the meantime, patient to call office if any issues or problems arise.   [name], DPM

## 2020-01-13 ENCOUNTER — Telehealth: Payer: Self-pay | Admitting: *Deleted

## 2020-01-13 DIAGNOSIS — I739 Peripheral vascular disease, unspecified: Secondary | ICD-10-CM

## 2020-01-13 DIAGNOSIS — L97312 Non-pressure chronic ulcer of right ankle with fat layer exposed: Secondary | ICD-10-CM

## 2020-01-13 DIAGNOSIS — Z9889 Other specified postprocedural states: Secondary | ICD-10-CM

## 2020-01-13 NOTE — Telephone Encounter (Signed)
Done

## 2020-01-13 NOTE — Telephone Encounter (Signed)
I spoke with [facility] - Enrique Sack and she states if pt is currently established, she will send order to the Child psychotherapist. Faxed order for wheelchair bariatric to [facility].

## 2020-01-13 NOTE — Telephone Encounter (Signed)
-----   Message from [name], [place] sent at 01/12/2020  7:41 PM EDT ----- Regarding: [facility] Val Please contact home health to see if they can assist me with ordering a wheel chair for this patient Thanks Dr. Marylene Land

## 2020-01-17 ENCOUNTER — Telehealth: Payer: Self-pay

## 2020-01-17 NOTE — Telephone Encounter (Signed)
Breanna from [facility] called requesting notes/clinicals to accompany the request/Rx for pt's wheelchair.  Last Clinicals were faxed to [facility] at [phone]

## 2020-01-19 ENCOUNTER — Ambulatory Visit (INDEPENDENT_AMBULATORY_CARE_PROVIDER_SITE_OTHER): Payer: PPO | Admitting: Sports Medicine

## 2020-01-19 ENCOUNTER — Other Ambulatory Visit: Payer: Self-pay

## 2020-01-19 ENCOUNTER — Encounter: Payer: Self-pay | Admitting: Sports Medicine

## 2020-01-19 DIAGNOSIS — L03115 Cellulitis of right lower limb: Secondary | ICD-10-CM

## 2020-01-19 DIAGNOSIS — L97312 Non-pressure chronic ulcer of right ankle with fat layer exposed: Secondary | ICD-10-CM

## 2020-01-19 DIAGNOSIS — E1142 Type 2 diabetes mellitus with diabetic polyneuropathy: Secondary | ICD-10-CM

## 2020-01-19 DIAGNOSIS — I739 Peripheral vascular disease, unspecified: Secondary | ICD-10-CM

## 2020-01-19 DIAGNOSIS — L02415 Cutaneous abscess of right lower limb: Secondary | ICD-10-CM

## 2020-01-19 DIAGNOSIS — Z9889 Other specified postprocedural states: Secondary | ICD-10-CM

## 2020-01-19 NOTE — Progress Notes (Signed)
Subjective: Scott Anderson is a 54 y.o. male patient seen today in office for POV # 4 (DOS 12-16-19), S/P R ankle I&D and wound debridement. Patient admits some pain at surgical site but is doing good nurses that is healing, denies calf pain but leg pain is getting better like before, denies headache, chest pain, shortness of breath, nausea, vomiting, fever, or chills.  No other issues noted.   Patient Active Problem List   Diagnosis Date Noted  . Pituitary insufficiency ([place]) 12/11/2015  . Hypogonadism male 11/29/2015  . Erectile dysfunction 11/26/2015  . Morbid obesity ([place]) 11/26/2015  . Diabetes ([place]) 11/26/2015  . HTN (hypertension) 11/26/2015  . Hypothyroidism 11/26/2015  . Anxiety state 11/26/2015  . Hypertriglyceridemia 11/26/2015    Current Outpatient Medications on File Prior to Visit  Medication Sig Dispense Refill  . clomiPHENE (CLOMID) 50 MG tablet Take 0.5 tablets (25 mg total) by mouth daily. 10 tablet 0  . diazepam (VALIUM) 5 MG tablet TK 1 T PO  TID PRN  0  . fenofibrate 160 MG tablet TK 1 T PO D  3  . gabapentin (NEURONTIN) 300 MG capsule 3 (three) times daily.   1  . gemfibrozil (LOPID) 600 MG tablet Take by mouth.    . Insulin Glargine (TOUJEO SOLOSTAR) 300 UNIT/ML SOPN Inject 500 Units into the skin every morning. And pen needles 2/day 21 pen 11  . Insulin Syringe-Needle U-100 (B-D INS SYRINGE 2CC/29GX1/2") 29G X 1/2" 2 ML MISC Used for daily insulin injections 2x. 200 each 5  . levothyroxine (SYNTHROID, LEVOTHROID) 75 MCG tablet Take 1 tablet (75 mcg total) by mouth daily. 90 tablet 3  . lisinopril (PRINIVIL,ZESTRIL) 40 MG tablet TK 1 T PO D  5  . metFORMIN (GLUCOPHAGE) 1000 MG tablet Take 1,000 mg by mouth 2 (two) times daily with a meal.    . metoprolol tartrate (LOPRESSOR) 25 MG tablet   4  . Omega-3 1000 MG CAPS Take by mouth.    . oxyCODONE (OXY IR/ROXICODONE) 5 MG immediate release tablet Take 1 tablet (5 mg total) by mouth every 6 (six) hours as needed for up  to 7 days for severe pain. 28 tablet 0  . potassium chloride (K-DUR,KLOR-CON) 10 MEQ tablet Take 10 mEq by mouth 2 (two) times daily. Reported on 11/26/2015    . PROAIR HFA 108 (90 Base) MCG/ACT inhaler INL 2 PFS PO Q 6 H PRN  5  . sildenafil (REVATIO) 20 MG tablet 2-5 tabs, as needed for ED symptoms 50 tablet 11  . sulfamethoxazole-trimethoprim (BACTRIM) 400-80 MG tablet Take 1 tablet by mouth 2 (two) times daily. 28 tablet 0   No current facility-administered medications on file prior to visit.    No Known Allergies  Objective: There were no vitals filed for this visit.  General: No acute distress, AAOx3  Right foot: I&D/Medial ankle ulceration with a mix of fibrogranular tissue and central blood clot that measures 7 x 8 cm with rolled margins, lateral ankle ulcer measures 3.5 mm x2x0.5 with joint capsule exposure that is slowly improving with more granular tissue noted, decreased cellulitis, decreased warmth, bloody drainage, no malodor, Capillary fill time <3 seconds in all digits, gross sensation present via light touch to right foot. Protective sensation absent. Decreased pain to leg, no pain to calf.  No pain with calf compression.   Assessment and Plan:  Problem List Items Addressed This Visit    None    Visit Diagnoses    Ankle ulcer,  right, with fat layer exposed (HCC)    -  Primary   PVD (peripheral vascular disease) (HCC)       Post-operative state       Diabetic polyneuropathy associated with type 2 diabetes mellitus (HCC)       Cellulitis and abscess of right leg          -Patient seen and evaluated -Cleansed ulcerations. Applied PRISMA and dry sterile dressing to surgical site right foot secured with ACE wrap and stockinet  -Advised patient to make sure to keep dressings clean, dry, and intact to right surgical site; dressings to be changed weekly by home health using Durafiber or silver alginate on Mondays like before -May use foot miracle cream to dry skin areas but  avoid open wounds like before -Continue with Bactrim until completed -Advised patient to continue with post-op shoe on right and rolling walker until he can get his wheelchair -Advised patient to limit activity to necessity  -Advised patient to ice and elevate as necessary  -Refilled oxycodone for pain  -Will plan for continued wound care at next office visit. In the meantime, patient to call office if any issues or problems arise.   [name], DPM

## 2020-01-20 NOTE — Telephone Encounter (Signed)
Ok great thanks! °

## 2020-01-20 NOTE — Telephone Encounter (Signed)
-----   Message from [name], [place] sent at 01/19/2020  8:42 PM EDT ----- Regarding: Wheelchair Patient reports that he has not heard anything about his wheelchair.  Please update patient on status of order. Thanks Dr. Marylene Land

## 2020-01-20 NOTE — Telephone Encounter (Signed)
[facility] - Dance movement psychotherapist is in the process of getting pt a wheelchair, and transferred to the Child psychotherapist. Left message for Social Worker to contact pt and me with status.

## 2020-01-21 DIAGNOSIS — N179 Acute kidney failure, unspecified: Secondary | ICD-10-CM | POA: Diagnosis not present

## 2020-01-21 DIAGNOSIS — B951 Streptococcus, group B, as the cause of diseases classified elsewhere: Secondary | ICD-10-CM | POA: Diagnosis not present

## 2020-01-21 DIAGNOSIS — E039 Hypothyroidism, unspecified: Secondary | ICD-10-CM | POA: Diagnosis not present

## 2020-01-21 DIAGNOSIS — Z79891 Long term (current) use of opiate analgesic: Secondary | ICD-10-CM | POA: Diagnosis not present

## 2020-01-21 DIAGNOSIS — I110 Hypertensive heart disease with heart failure: Secondary | ICD-10-CM | POA: Diagnosis not present

## 2020-01-21 DIAGNOSIS — Z791 Long term (current) use of non-steroidal anti-inflammatories (NSAID): Secondary | ICD-10-CM | POA: Diagnosis not present

## 2020-01-21 DIAGNOSIS — E1142 Type 2 diabetes mellitus with diabetic polyneuropathy: Secondary | ICD-10-CM | POA: Diagnosis not present

## 2020-01-21 DIAGNOSIS — D72829 Elevated white blood cell count, unspecified: Secondary | ICD-10-CM | POA: Diagnosis not present

## 2020-01-21 DIAGNOSIS — F419 Anxiety disorder, unspecified: Secondary | ICD-10-CM | POA: Diagnosis not present

## 2020-01-21 DIAGNOSIS — F329 Major depressive disorder, single episode, unspecified: Secondary | ICD-10-CM | POA: Diagnosis not present

## 2020-01-21 DIAGNOSIS — E669 Obesity, unspecified: Secondary | ICD-10-CM | POA: Diagnosis not present

## 2020-01-21 DIAGNOSIS — Z8631 Personal history of diabetic foot ulcer: Secondary | ICD-10-CM | POA: Diagnosis not present

## 2020-01-21 DIAGNOSIS — E1165 Type 2 diabetes mellitus with hyperglycemia: Secondary | ICD-10-CM | POA: Diagnosis not present

## 2020-01-21 DIAGNOSIS — M1991 Primary osteoarthritis, unspecified site: Secondary | ICD-10-CM | POA: Diagnosis not present

## 2020-01-21 DIAGNOSIS — L02611 Cutaneous abscess of right foot: Secondary | ICD-10-CM | POA: Diagnosis not present

## 2020-01-21 DIAGNOSIS — Z452 Encounter for adjustment and management of vascular access device: Secondary | ICD-10-CM | POA: Diagnosis not present

## 2020-01-21 DIAGNOSIS — E7800 Pure hypercholesterolemia, unspecified: Secondary | ICD-10-CM | POA: Diagnosis not present

## 2020-01-21 DIAGNOSIS — Z794 Long term (current) use of insulin: Secondary | ICD-10-CM | POA: Diagnosis not present

## 2020-01-21 DIAGNOSIS — Z79890 Hormone replacement therapy (postmenopausal): Secondary | ICD-10-CM | POA: Diagnosis not present

## 2020-01-21 DIAGNOSIS — E871 Hypo-osmolality and hyponatremia: Secondary | ICD-10-CM | POA: Diagnosis not present

## 2020-01-21 DIAGNOSIS — I509 Heart failure, unspecified: Secondary | ICD-10-CM | POA: Diagnosis not present

## 2020-01-21 DIAGNOSIS — Z7982 Long term (current) use of aspirin: Secondary | ICD-10-CM | POA: Diagnosis not present

## 2020-01-21 DIAGNOSIS — L03115 Cellulitis of right lower limb: Secondary | ICD-10-CM | POA: Diagnosis not present

## 2020-01-21 DIAGNOSIS — Z792 Long term (current) use of antibiotics: Secondary | ICD-10-CM | POA: Diagnosis not present

## 2020-01-23 NOTE — Telephone Encounter (Signed)
[facility] -[place] states paperwork for Wheelchair went off last week and they should be calling pt.

## 2020-01-26 ENCOUNTER — Other Ambulatory Visit: Payer: Self-pay

## 2020-01-26 ENCOUNTER — Ambulatory Visit (INDEPENDENT_AMBULATORY_CARE_PROVIDER_SITE_OTHER): Payer: PPO | Admitting: Sports Medicine

## 2020-01-26 ENCOUNTER — Encounter: Payer: Self-pay | Admitting: Sports Medicine

## 2020-01-26 DIAGNOSIS — L97312 Non-pressure chronic ulcer of right ankle with fat layer exposed: Secondary | ICD-10-CM

## 2020-01-26 DIAGNOSIS — E1142 Type 2 diabetes mellitus with diabetic polyneuropathy: Secondary | ICD-10-CM

## 2020-01-26 DIAGNOSIS — I739 Peripheral vascular disease, unspecified: Secondary | ICD-10-CM

## 2020-01-26 DIAGNOSIS — L03115 Cellulitis of right lower limb: Secondary | ICD-10-CM

## 2020-01-26 DIAGNOSIS — L02415 Cutaneous abscess of right lower limb: Secondary | ICD-10-CM

## 2020-01-26 DIAGNOSIS — Z9889 Other specified postprocedural states: Secondary | ICD-10-CM

## 2020-01-26 MED ORDER — OXYCODONE HCL 5 MG PO TABS: 5.0000 mg | ORAL_TABLET | Freq: Four times a day (QID) | ORAL | 0 refills | Status: DC | PRN

## 2020-01-26 NOTE — Progress Notes (Signed)
Subjective: Scott Anderson is a 54 y.o. male patient seen today in office for POV # 5 (DOS 12-16-19), S/P R ankle I&D and wound debridement. Patient admits that his home nurse says that it is getting smaller with less drainage and says that it is looking better.  Patient reports some pain at surgical site and did not get refill of oxycodone reports that he went to the pharmacy and they did not have the prescription, denies calf pain but leg pain is getting better like before, denies headache, chest pain, shortness of breath, nausea, vomiting, fever, or chills.  Reports that he has a few more tablets of his Bactrim antibiotic left.  No other issues noted.   Patient Active Problem List   Diagnosis Date Noted  . Pituitary insufficiency (HCC) 12/11/2015  . Hypogonadism male 11/29/2015  . Erectile dysfunction 11/26/2015  . Morbid obesity (HCC) 11/26/2015  . Diabetes (HCC) 11/26/2015  . HTN (hypertension) 11/26/2015  . Hypothyroidism 11/26/2015  . Anxiety state 11/26/2015  . Hypertriglyceridemia 11/26/2015    Current Outpatient Medications on File Prior to Visit  Medication Sig Dispense Refill  . clomiPHENE (CLOMID) 50 MG tablet Take 0.5 tablets (25 mg total) by mouth daily. 10 tablet 0  . diazepam (VALIUM) 5 MG tablet TK 1 T PO  TID PRN  0  . fenofibrate 160 MG tablet TK 1 T PO D  3  . gabapentin (NEURONTIN) 300 MG capsule 3 (three) times daily.   1  . gemfibrozil (LOPID) 600 MG tablet Take by mouth.    . Insulin Glargine (TOUJEO SOLOSTAR) 300 UNIT/ML SOPN Inject 500 Units into the skin every morning. And pen needles 2/day 21 pen 11  . Insulin Syringe-Needle U-100 (B-D INS SYRINGE 2CC/29GX1/2") 29G X 1/2" 2 ML MISC Used for daily insulin injections 2x. 200 each 5  . levothyroxine (SYNTHROID, LEVOTHROID) 75 MCG tablet Take 1 tablet (75 mcg total) by mouth daily. 90 tablet 3  . lisinopril (PRINIVIL,ZESTRIL) 40 MG tablet TK 1 T PO D  5  . metFORMIN (GLUCOPHAGE) 1000 MG tablet Take 1,000 mg by mouth  2 (two) times daily with a meal.    . metoprolol tartrate (LOPRESSOR) 25 MG tablet   4  . Omega-3 1000 MG CAPS Take by mouth.    . potassium chloride (K-DUR,KLOR-CON) 10 MEQ tablet Take 10 mEq by mouth 2 (two) times daily. Reported on 11/26/2015    . PROAIR HFA 108 (90 Base) MCG/ACT inhaler INL 2 PFS PO Q 6 H PRN  5  . sildenafil (REVATIO) 20 MG tablet 2-5 tabs, as needed for ED symptoms 50 tablet 11  . sulfamethoxazole-trimethoprim (BACTRIM) 400-80 MG tablet Take 1 tablet by mouth 2 (two) times daily. 28 tablet 0   No current facility-administered medications on file prior to visit.    No Known Allergies  Objective: There were no vitals filed for this visit.  General: No acute distress, AAOx3  Right foot: I&D/Medial ankle ulceration with a mix of fibrogranular tissue and central blood clot that measures 6.5 x 8cm with rolled margins, lateral ankle ulcer measures 3.0x2x0.5cm with joint capsule exposure that is slowly improving with more granular tissue noted, decreased cellulitis, decreased warmth, bloody drainage, no malodor, Capillary fill time <3 seconds in all digits, gross sensation present via light touch to right foot. Protective sensation absent. Decreased pain to leg, no pain to calf.  No pain with calf compression.   Assessment and Plan:  Problem List Items Addressed This Visit  None    Visit Diagnoses    Ankle ulcer, right, with fat layer exposed ([place])    -  Primary   Relevant Medications   oxyCODONE (OXY IR/ROXICODONE) 5 MG immediate release tablet   Post-operative state       Relevant Medications   oxyCODONE (OXY IR/ROXICODONE) 5 MG immediate release tablet   PVD (peripheral vascular disease) (HCC)       Diabetic polyneuropathy associated with type 2 diabetes mellitus ([place])       Cellulitis and abscess of right leg          -Patient seen and evaluated -Cleansed ulcerations. Applied PRISMA and dry sterile dressing to surgical site right foot secured with ACE wrap and  stockinet  -Advised patient to make sure to keep dressings clean, dry, and intact to right surgical site; dressings to be changed weekly by home health using Durafiber or silver alginate on Mondays like previous -May use foot miracle cream to dry skin areas but avoid open wounds like before to the areas of dry skin -Continue with Bactrim until completed, has a few more tablets left -Advised patient to continue with post-op shoe on right and rolling walker until he can get his wheelchair like before -Advised patient to limit activity to necessity  -Advised patient to ice and elevate as necessary  -Refilled oxycodone for pain this visit -Will plan for continued wound care at next office visit. In the meantime, patient to call office if any issues or problems arise.   Landis Martins, DPM

## 2020-01-27 DIAGNOSIS — J449 Chronic obstructive pulmonary disease, unspecified: Secondary | ICD-10-CM | POA: Diagnosis not present

## 2020-02-02 ENCOUNTER — Other Ambulatory Visit: Payer: Self-pay

## 2020-02-02 ENCOUNTER — Encounter: Payer: Self-pay | Admitting: Sports Medicine

## 2020-02-02 ENCOUNTER — Ambulatory Visit (INDEPENDENT_AMBULATORY_CARE_PROVIDER_SITE_OTHER): Payer: PPO | Admitting: Sports Medicine

## 2020-02-02 DIAGNOSIS — Z9889 Other specified postprocedural states: Secondary | ICD-10-CM

## 2020-02-02 DIAGNOSIS — I739 Peripheral vascular disease, unspecified: Secondary | ICD-10-CM

## 2020-02-02 DIAGNOSIS — E1142 Type 2 diabetes mellitus with diabetic polyneuropathy: Secondary | ICD-10-CM

## 2020-02-02 DIAGNOSIS — L97312 Non-pressure chronic ulcer of right ankle with fat layer exposed: Secondary | ICD-10-CM

## 2020-02-02 MED ORDER — OXYCODONE HCL 5 MG PO TABS: 5.0000 mg | ORAL_TABLET | Freq: Four times a day (QID) | ORAL | 0 refills | Status: DC | PRN

## 2020-02-02 NOTE — Progress Notes (Signed)
Subjective: Scott Anderson is a 54 y.o. male patient seen today in office for POV # 6 (DOS 12-16-19), S/P R ankle I&D and wound debridement.  Reports that the nurse says that it is healing same amount of drainage but less redness and swelling, denies headache, chest pain, shortness of breath, nausea, vomiting, fever, or chills.  Reports that he has a few more tablets of his Bactrim antibiotic left and has been using oxycodone to help with pain at the end of the day and has been continuing with his surgical shoe and rolling walker as instructed.  No other issues noted.   Fasting blood sugar 130 on yesterday  Patient Active Problem List   Diagnosis Date Noted  . Pituitary insufficiency ([place]) 12/11/2015  . Hypogonadism male 11/29/2015  . Erectile dysfunction 11/26/2015  . Morbid obesity ([place]) 11/26/2015  . Diabetes ([place]) 11/26/2015  . HTN (hypertension) 11/26/2015  . Hypothyroidism 11/26/2015  . Anxiety state 11/26/2015  . Hypertriglyceridemia 11/26/2015    Current Outpatient Medications on File Prior to Visit  Medication Sig Dispense Refill  . clomiPHENE (CLOMID) 50 MG tablet Take 0.5 tablets (25 mg total) by mouth daily. 10 tablet 0  . diazepam (VALIUM) 5 MG tablet TK 1 T PO  TID PRN  0  . fenofibrate 160 MG tablet TK 1 T PO D  3  . gabapentin (NEURONTIN) 300 MG capsule 3 (three) times daily.   1  . gemfibrozil (LOPID) 600 MG tablet Take by mouth.    . Insulin Glargine (TOUJEO SOLOSTAR) 300 UNIT/ML SOPN Inject 500 Units into the skin every morning. And pen needles 2/day 21 pen 11  . Insulin Syringe-Needle U-100 (B-D INS SYRINGE 2CC/29GX1/2") 29G X 1/2" 2 ML MISC Used for daily insulin injections 2x. 200 each 5  . levothyroxine (SYNTHROID, LEVOTHROID) 75 MCG tablet Take 1 tablet (75 mcg total) by mouth daily. 90 tablet 3  . lisinopril (PRINIVIL,ZESTRIL) 40 MG tablet TK 1 T PO D  5  . metFORMIN (GLUCOPHAGE) 1000 MG tablet Take 1,000 mg by mouth 2 (two) times daily with a meal.    .  metoprolol tartrate (LOPRESSOR) 25 MG tablet   4  . Omega-3 1000 MG CAPS Take by mouth.    . oxyCODONE (OXY IR/ROXICODONE) 5 MG immediate release tablet Take 1 tablet (5 mg total) by mouth every 6 (six) hours as needed for up to 7 days for severe pain. 28 tablet 0  . potassium chloride (K-DUR,KLOR-CON) 10 MEQ tablet Take 10 mEq by mouth 2 (two) times daily. Reported on 11/26/2015    . PROAIR HFA 108 (90 Base) MCG/ACT inhaler INL 2 PFS PO Q 6 H PRN  5  . sildenafil (REVATIO) 20 MG tablet 2-5 tabs, as needed for ED symptoms 50 tablet 11  . sulfamethoxazole-trimethoprim (BACTRIM) 400-80 MG tablet Take 1 tablet by mouth 2 (two) times daily. 28 tablet 0   No current facility-administered medications on file prior to visit.    No Known Allergies  Objective: There were no vitals filed for this visit.  General: No acute distress, AAOx3  Right foot: I&D/Medial ankle ulceration with a mix of fibrogranular tissue and central blood clot that measures 6.9x 8.4cm with rolled margins, lateral ankle ulcer measures 3.1x 1.6 x0.3cm with fibrogranular tissue noted in the wound base no more joint capsule exposure, decreased cellulitis, decreased warmth, bloody drainage, no malodor, there is small abrasions to the leg at the anterior shin from patient bumping the leg when walking with no  opening or acute signs of infection, capillary fill time <3 seconds in all digits, gross sensation present via light touch to right foot. Protective sensation absent. Decreased pain to leg, no pain to calf.  No pain with calf compression.   Assessment and Plan:  Problem List Items Addressed This Visit    None    Visit Diagnoses    Ankle ulcer, right, with fat layer exposed (HCC)    -  Primary   Post-operative state       PVD (peripheral vascular disease) (HCC)       Diabetic polyneuropathy associated with type 2 diabetes mellitus (HCC)          -Patient seen and evaluated -Cleansed ulcerations. Applied PRISMA and active  sorb dry sterile dressing to surgical site right foot secured with ACE wrap and stockinet also applied antibiotic cream to abrasion on leg as well as foot miracle cream to dry skin areas. -Advised patient to make sure to keep dressings clean, dry, and intact to right surgical site; dressings to be changed weekly by home health using alginate and absorbent secondary dressing layer like Maxorb or active sorb on Mondays like previous -May use foot miracle cream to dry skin areas but avoid open wounds like before to the areas of dry skin -Continue with Bactrim until completed, has a few more tablets left again per patient -Refill pain medication of oxycodone to take as needed for severe pain -Advised patient to continue with post-op shoe on right and rolling walker until he can get his wheelchair like before  -Advised patient to limit activity to necessity  -Advised patient to ice and elevate as necessary  -Will plan for continued wound care at next office visit. In the meantime, patient to call office if any issues or problems arise.   [name], DPM

## 2020-02-03 ENCOUNTER — Telehealth: Payer: Self-pay | Admitting: *Deleted

## 2020-02-03 NOTE — Telephone Encounter (Signed)
-----   Message from Scott Anderson, [place] sent at 02/02/2020  6:44 PM EDT ----- Regarding: Home nursing wound care orders update Apply alginate dressing to the medial ankle and anterior ankle wound covered with dry dressing once weekly may apply sample of foot miracle cream to the leg for dry skin issues.  May also use if there is a lot of drainage Maxorb or an absorbent layer over the alginate to help with drainage control.

## 2020-02-03 NOTE — Telephone Encounter (Signed)
Faxed copy of Dr. Wynema Birch 02/02/2020 6:44pm to [facility].

## 2020-02-09 ENCOUNTER — Other Ambulatory Visit: Payer: Self-pay

## 2020-02-09 ENCOUNTER — Encounter: Payer: Self-pay | Admitting: Sports Medicine

## 2020-02-09 ENCOUNTER — Ambulatory Visit (INDEPENDENT_AMBULATORY_CARE_PROVIDER_SITE_OTHER): Payer: PPO | Admitting: Sports Medicine

## 2020-02-09 DIAGNOSIS — Z9889 Other specified postprocedural states: Secondary | ICD-10-CM

## 2020-02-09 DIAGNOSIS — E1142 Type 2 diabetes mellitus with diabetic polyneuropathy: Secondary | ICD-10-CM

## 2020-02-09 DIAGNOSIS — L97312 Non-pressure chronic ulcer of right ankle with fat layer exposed: Secondary | ICD-10-CM

## 2020-02-09 DIAGNOSIS — L02415 Cutaneous abscess of right lower limb: Secondary | ICD-10-CM

## 2020-02-09 DIAGNOSIS — I739 Peripheral vascular disease, unspecified: Secondary | ICD-10-CM

## 2020-02-09 DIAGNOSIS — L03115 Cellulitis of right lower limb: Secondary | ICD-10-CM

## 2020-02-09 MED ORDER — OXYCODONE HCL 5 MG PO TABS: 5.0000 mg | ORAL_TABLET | Freq: Four times a day (QID) | ORAL | 0 refills | Status: DC | PRN

## 2020-02-09 NOTE — Progress Notes (Signed)
Subjective: Scott Anderson is a 54 y.o. male patient seen today in office for POV # 7 (DOS 12-16-19), S/P R ankle I&D and wound debridement.  Reports that the nurse has been helping to change the dressing and he has not paid much attention to it, denies headache, chest pain, shortness of breath, nausea, vomiting, fever, or chills.  No other issues noted.   Fasting blood sugar 130 today  Patient Active Problem List   Diagnosis Date Noted  . Pituitary insufficiency ([place]) 12/11/2015  . Hypogonadism male 11/29/2015  . Erectile dysfunction 11/26/2015  . Morbid obesity ([place]) 11/26/2015  . Diabetes ([place]) 11/26/2015  . HTN (hypertension) 11/26/2015  . Hypothyroidism 11/26/2015  . Anxiety state 11/26/2015  . Hypertriglyceridemia 11/26/2015    Current Outpatient Medications on File Prior to Visit  Medication Sig Dispense Refill  . clomiPHENE (CLOMID) 50 MG tablet Take 0.5 tablets (25 mg total) by mouth daily. 10 tablet 0  . diazepam (VALIUM) 5 MG tablet TK 1 T PO  TID PRN  0  . fenofibrate 160 MG tablet TK 1 T PO D  3  . gabapentin (NEURONTIN) 300 MG capsule 3 (three) times daily.   1  . gemfibrozil (LOPID) 600 MG tablet Take by mouth.    . Insulin Glargine (TOUJEO SOLOSTAR) 300 UNIT/ML SOPN Inject 500 Units into the skin every morning. And pen needles 2/day 21 pen 11  . Insulin Syringe-Needle U-100 (B-D INS SYRINGE 2CC/29GX1/2") 29G X 1/2" 2 ML MISC Used for daily insulin injections 2x. 200 each 5  . levothyroxine (SYNTHROID, LEVOTHROID) 75 MCG tablet Take 1 tablet (75 mcg total) by mouth daily. 90 tablet 3  . lisinopril (PRINIVIL,ZESTRIL) 40 MG tablet TK 1 T PO D  5  . metFORMIN (GLUCOPHAGE) 1000 MG tablet Take 1,000 mg by mouth 2 (two) times daily with a meal.    . metoprolol tartrate (LOPRESSOR) 25 MG tablet   4  . Omega-3 1000 MG CAPS Take by mouth.    . potassium chloride (K-DUR,KLOR-CON) 10 MEQ tablet Take 10 mEq by mouth 2 (two) times daily. Reported on 11/26/2015    . PROAIR HFA 108 (90  Base) MCG/ACT inhaler INL 2 PFS PO Q 6 H PRN  5  . sildenafil (REVATIO) 20 MG tablet 2-5 tabs, as needed for ED symptoms 50 tablet 11  . sulfamethoxazole-trimethoprim (BACTRIM) 400-80 MG tablet Take 1 tablet by mouth 2 (two) times daily. 28 tablet 0   No current facility-administered medications on file prior to visit.    No Known Allergies  Objective: There were no vitals filed for this visit.  General: No acute distress, AAOx3  Right foot: I&D/Medial ankle ulceration with a mix of fibrogranular tissue and central blood clot that measures 5.9x 7 cm with granular margins, lateral ankle ulcer measures 2.8 x 2 x0.3cm with fibrogranular tissue noted in the wound base, decreased cellulitis, decreased warmth, bloody drainage, no malodor, there is small abrasions to the leg at the anterior shin appear to be scabbed over, no acute signs of infection, capillary fill time <3 seconds in all digits, gross sensation present via light touch to right foot. Protective sensation absent. Decreased pain to leg, no pain to calf.  No pain with calf compression.   Assessment and Plan:  Problem List Items Addressed This Visit    None    Visit Diagnoses    PVD (peripheral vascular disease) ([place])    -  Primary   Ankle ulcer, right, with fat layer exposed (  HCC)       Relevant Medications   oxyCODONE (OXY IR/ROXICODONE) 5 MG immediate release tablet   Post-operative state       Relevant Medications   oxyCODONE (OXY IR/ROXICODONE) 5 MG immediate release tablet   Diabetic polyneuropathy associated with type 2 diabetes mellitus (HCC)       Cellulitis and abscess of right leg          -Patient seen and evaluated -Cleansed ulcerations. Applied PRISMA and dry sterile dressing to surgical site right foot secured with ACE wrap and stockinet also applied antibiotic cream to abrasion on leg as well as foot miracle cream to dry skin areas like previous. Sample provided to patient to give to nurse to apply as  well -Advised patient to make sure to keep dressings clean, dry, and intact to right surgical site; dressings to be changed weekly by home health using alginate and dry dressings on Mondays -Dispensed replacement postoperative shoe since current shoes is really worn -Refill pain medication of oxycodone to take as needed for severe pain -Advised patient to continue with post-op shoe on right and rolling walker until he can get his wheelchair like before  -Advised patient to limit activity to necessity  -Advised patient to ice and elevate as necessary  -Will plan for continued wound care at next office visit. In the meantime, patient to call office if any issues or problems arise.   [name], DPM

## 2020-02-16 ENCOUNTER — Ambulatory Visit (INDEPENDENT_AMBULATORY_CARE_PROVIDER_SITE_OTHER): Payer: PPO | Admitting: Sports Medicine

## 2020-02-16 ENCOUNTER — Other Ambulatory Visit: Payer: Self-pay

## 2020-02-16 ENCOUNTER — Encounter: Payer: Self-pay | Admitting: Sports Medicine

## 2020-02-16 DIAGNOSIS — E1142 Type 2 diabetes mellitus with diabetic polyneuropathy: Secondary | ICD-10-CM

## 2020-02-16 DIAGNOSIS — M25571 Pain in right ankle and joints of right foot: Secondary | ICD-10-CM

## 2020-02-16 DIAGNOSIS — I739 Peripheral vascular disease, unspecified: Secondary | ICD-10-CM

## 2020-02-16 DIAGNOSIS — L03115 Cellulitis of right lower limb: Secondary | ICD-10-CM

## 2020-02-16 DIAGNOSIS — L02415 Cutaneous abscess of right lower limb: Secondary | ICD-10-CM

## 2020-02-16 DIAGNOSIS — L97312 Non-pressure chronic ulcer of right ankle with fat layer exposed: Secondary | ICD-10-CM

## 2020-02-16 DIAGNOSIS — Z9889 Other specified postprocedural states: Secondary | ICD-10-CM

## 2020-02-16 MED ORDER — OXYCODONE HCL 5 MG PO TABS: 5.0000 mg | ORAL_TABLET | Freq: Four times a day (QID) | ORAL | 0 refills | Status: DC | PRN

## 2020-02-16 NOTE — Progress Notes (Signed)
Subjective: Scott Anderson is a 54 y.o. male patient seen today in office for POV # 8 (DOS 12-16-19), S/P R ankle I&D and wound debridement.  Reports that the nurse has been helping to change the dressing and his wife says it looks better as well, reports he has not paid much attention to it, denies headache, chest pain, shortness of breath, nausea, vomiting, fever, or chills.  No other issues noted.   Fasting blood sugar 130 like before.   Patient Active Problem List   Diagnosis Date Noted  . Pituitary insufficiency (HCC) 12/11/2015  . Hypogonadism male 11/29/2015  . Erectile dysfunction 11/26/2015  . Morbid obesity (HCC) 11/26/2015  . Diabetes (HCC) 11/26/2015  . HTN (hypertension) 11/26/2015  . Hypothyroidism 11/26/2015  . Anxiety state 11/26/2015  . Hypertriglyceridemia 11/26/2015    Current Outpatient Medications on File Prior to Visit  Medication Sig Dispense Refill  . clomiPHENE (CLOMID) 50 MG tablet Take 0.5 tablets (25 mg total) by mouth daily. 10 tablet 0  . diazepam (VALIUM) 5 MG tablet TK 1 T PO  TID PRN  0  . fenofibrate 160 MG tablet TK 1 T PO D  3  . gabapentin (NEURONTIN) 300 MG capsule 3 (three) times daily.   1  . gemfibrozil (LOPID) 600 MG tablet Take by mouth.    . Insulin Glargine (TOUJEO SOLOSTAR) 300 UNIT/ML SOPN Inject 500 Units into the skin every morning. And pen needles 2/day 21 pen 11  . Insulin Syringe-Needle U-100 (B-D INS SYRINGE 2CC/29GX1/2") 29G X 1/2" 2 ML MISC Used for daily insulin injections 2x. 200 each 5  . levothyroxine (SYNTHROID, LEVOTHROID) 75 MCG tablet Take 1 tablet (75 mcg total) by mouth daily. 90 tablet 3  . lisinopril (PRINIVIL,ZESTRIL) 40 MG tablet TK 1 T PO D  5  . metFORMIN (GLUCOPHAGE) 1000 MG tablet Take 1,000 mg by mouth 2 (two) times daily with a meal.    . metoprolol tartrate (LOPRESSOR) 25 MG tablet   4  . Omega-3 1000 MG CAPS Take by mouth.    . potassium chloride (K-DUR,KLOR-CON) 10 MEQ tablet Take 10 mEq by mouth 2 (two)  times daily. Reported on 11/26/2015    . PROAIR HFA 108 (90 Base) MCG/ACT inhaler INL 2 PFS PO Q 6 H PRN  5  . sildenafil (REVATIO) 20 MG tablet 2-5 tabs, as needed for ED symptoms 50 tablet 11  . sulfamethoxazole-trimethoprim (BACTRIM) 400-80 MG tablet Take 1 tablet by mouth 2 (two) times daily. 28 tablet 0   No current facility-administered medications on file prior to visit.    No Known Allergies  Objective: There were no vitals filed for this visit.  General: No acute distress, AAOx3  Right foot: I&D/Medial ankle ulceration with a mix of fibrogranular tissue and central blood clot that measures 5.5x 7 cm with granular margins, lateral ankle ulcer measures 2.6 x 1.5 x0.3cm with fibrogranular tissue noted in the wound base, decreased cellulitis, decreased warmth, bloody drainage, no malodor, there is small abrasions to the leg at the anterior shin appear to be scabbed over, no acute signs of infection, capillary fill time <3 seconds in all digits, gross sensation present via light touch to right foot. Protective sensation absent. Decreased pain to leg, no pain to calf.  No pain with calf compression.   Assessment and Plan:  Problem List Items Addressed This Visit    None    Visit Diagnoses    PVD (peripheral vascular disease) (HCC)    -  Primary   Ankle ulcer, right, with fat layer exposed ([place])       Relevant Medications   oxyCODONE (OXY IR/ROXICODONE) 5 MG immediate release tablet   Post-operative state       Relevant Medications   oxyCODONE (OXY IR/ROXICODONE) 5 MG immediate release tablet   Diabetic polyneuropathy associated with type 2 diabetes mellitus (HCC)       Cellulitis and abscess of right leg       Right ankle pain, unspecified chronicity          -Patient seen and evaluated -Cleansed ulcerations. Applied PRISMA and dry sterile dressing to surgical site right foot secured with ACE wrap and stockinet also applied antibiotic cream to abrasion on leg as well as foot  miracle cream to dry skin areas like previous. Sample provided to patient to give to nurse to apply as well like previous -Advised patient to make sure to keep dressings clean, dry, and intact to right surgical site; dressings to be changed weekly by home health using alginate and dry dressings on Mondays -Dispensed replacement postoperative shoe since current shoes is really worn -Refill pain medication of oxycodone to take as needed for severe pain like previous -Advised patient to continue with post-op shoe on right and rolling walker -Advised patient to limit activity to necessity  -Advised patient to ice and elevate as necessary  -Will plan for continued wound care at next office visit. In the meantime, patient to call office if any issues or problems arise.   Landis Martins, DPM

## 2020-02-17 ENCOUNTER — Telehealth: Payer: Self-pay

## 2020-02-17 NOTE — Telephone Encounter (Signed)
Yes recert for wound care and the week the office will be closed please see patient twice that week Thanks Dr. Kathie Rhodes

## 2020-02-17 NOTE — Telephone Encounter (Signed)
Kary RN from [facility] was notified of Dr. Wynema Birch approval for pre-cert and was advised to see pt twice the week of the 4th

## 2020-02-17 NOTE — Telephone Encounter (Signed)
Clydie Braun from [facility] called and LVM stating/requesting a verbal to re-cert pt to continue wound care once/week. Also, for the week of July 4th they would like to know if you want them to see the pt twice that week since the office will be close.

## 2020-02-20 DIAGNOSIS — I509 Heart failure, unspecified: Secondary | ICD-10-CM | POA: Diagnosis not present

## 2020-02-20 DIAGNOSIS — Z7982 Long term (current) use of aspirin: Secondary | ICD-10-CM | POA: Diagnosis not present

## 2020-02-20 DIAGNOSIS — Z452 Encounter for adjustment and management of vascular access device: Secondary | ICD-10-CM | POA: Diagnosis not present

## 2020-02-20 DIAGNOSIS — Z791 Long term (current) use of non-steroidal anti-inflammatories (NSAID): Secondary | ICD-10-CM | POA: Diagnosis not present

## 2020-02-20 DIAGNOSIS — Z794 Long term (current) use of insulin: Secondary | ICD-10-CM | POA: Diagnosis not present

## 2020-02-20 DIAGNOSIS — E039 Hypothyroidism, unspecified: Secondary | ICD-10-CM | POA: Diagnosis not present

## 2020-02-20 DIAGNOSIS — B951 Streptococcus, group B, as the cause of diseases classified elsewhere: Secondary | ICD-10-CM | POA: Diagnosis not present

## 2020-02-20 DIAGNOSIS — E7800 Pure hypercholesterolemia, unspecified: Secondary | ICD-10-CM | POA: Diagnosis not present

## 2020-02-20 DIAGNOSIS — Z792 Long term (current) use of antibiotics: Secondary | ICD-10-CM | POA: Diagnosis not present

## 2020-02-20 DIAGNOSIS — F419 Anxiety disorder, unspecified: Secondary | ICD-10-CM | POA: Diagnosis not present

## 2020-02-20 DIAGNOSIS — M1991 Primary osteoarthritis, unspecified site: Secondary | ICD-10-CM | POA: Diagnosis not present

## 2020-02-20 DIAGNOSIS — I110 Hypertensive heart disease with heart failure: Secondary | ICD-10-CM | POA: Diagnosis not present

## 2020-02-20 DIAGNOSIS — E669 Obesity, unspecified: Secondary | ICD-10-CM | POA: Diagnosis not present

## 2020-02-20 DIAGNOSIS — E1142 Type 2 diabetes mellitus with diabetic polyneuropathy: Secondary | ICD-10-CM | POA: Diagnosis not present

## 2020-02-20 DIAGNOSIS — L03115 Cellulitis of right lower limb: Secondary | ICD-10-CM | POA: Diagnosis not present

## 2020-02-20 DIAGNOSIS — E871 Hypo-osmolality and hyponatremia: Secondary | ICD-10-CM | POA: Diagnosis not present

## 2020-02-20 DIAGNOSIS — Z8631 Personal history of diabetic foot ulcer: Secondary | ICD-10-CM | POA: Diagnosis not present

## 2020-02-20 DIAGNOSIS — N179 Acute kidney failure, unspecified: Secondary | ICD-10-CM | POA: Diagnosis not present

## 2020-02-20 DIAGNOSIS — D72829 Elevated white blood cell count, unspecified: Secondary | ICD-10-CM | POA: Diagnosis not present

## 2020-02-20 DIAGNOSIS — Z79891 Long term (current) use of opiate analgesic: Secondary | ICD-10-CM | POA: Diagnosis not present

## 2020-02-20 DIAGNOSIS — L02611 Cutaneous abscess of right foot: Secondary | ICD-10-CM | POA: Diagnosis not present

## 2020-02-20 DIAGNOSIS — F329 Major depressive disorder, single episode, unspecified: Secondary | ICD-10-CM | POA: Diagnosis not present

## 2020-02-20 DIAGNOSIS — Z79890 Hormone replacement therapy (postmenopausal): Secondary | ICD-10-CM | POA: Diagnosis not present

## 2020-02-20 DIAGNOSIS — E1165 Type 2 diabetes mellitus with hyperglycemia: Secondary | ICD-10-CM | POA: Diagnosis not present

## 2020-02-23 ENCOUNTER — Encounter: Payer: Self-pay | Admitting: Sports Medicine

## 2020-02-23 ENCOUNTER — Ambulatory Visit (INDEPENDENT_AMBULATORY_CARE_PROVIDER_SITE_OTHER): Payer: PPO | Admitting: Sports Medicine

## 2020-02-23 DIAGNOSIS — I739 Peripheral vascular disease, unspecified: Secondary | ICD-10-CM

## 2020-02-23 DIAGNOSIS — Z9889 Other specified postprocedural states: Secondary | ICD-10-CM

## 2020-02-23 DIAGNOSIS — L97319 Non-pressure chronic ulcer of right ankle with unspecified severity: Secondary | ICD-10-CM

## 2020-02-23 DIAGNOSIS — L97312 Non-pressure chronic ulcer of right ankle with fat layer exposed: Secondary | ICD-10-CM

## 2020-02-23 DIAGNOSIS — E1142 Type 2 diabetes mellitus with diabetic polyneuropathy: Secondary | ICD-10-CM

## 2020-02-23 DIAGNOSIS — I83013 Varicose veins of right lower extremity with ulcer of ankle: Secondary | ICD-10-CM

## 2020-02-23 MED ORDER — OXYCODONE HCL 5 MG PO TABS: 5.0000 mg | ORAL_TABLET | Freq: Four times a day (QID) | ORAL | 0 refills | Status: DC | PRN

## 2020-02-23 NOTE — Progress Notes (Signed)
Subjective: Scott Anderson is a 54 y.o. male patient seen today in office for POV # 9 (DOS 12-16-19), S/P R ankle I&D and wound debridement.  Reports that the nurse has been helping to change the dressing and reports that it is looking better with less pain, denies headache, chest pain, shortness of breath, nausea, vomiting, fever, or chills.  No other issues noted.   Fasting blood sugar 140.  Patient Active Problem List   Diagnosis Date Noted  . Pituitary insufficiency (HCC) 12/11/2015  . Hypogonadism male 11/29/2015  . Erectile dysfunction 11/26/2015  . Morbid obesity (HCC) 11/26/2015  . Diabetes (HCC) 11/26/2015  . HTN (hypertension) 11/26/2015  . Hypothyroidism 11/26/2015  . Anxiety state 11/26/2015  . Hypertriglyceridemia 11/26/2015    Current Outpatient Medications on File Prior to Visit  Medication Sig Dispense Refill  . clomiPHENE (CLOMID) 50 MG tablet Take 0.5 tablets (25 mg total) by mouth daily. 10 tablet 0  . diazepam (VALIUM) 5 MG tablet TK 1 T PO  TID PRN  0  . fenofibrate 160 MG tablet TK 1 T PO D  3  . gabapentin (NEURONTIN) 300 MG capsule 3 (three) times daily.   1  . gemfibrozil (LOPID) 600 MG tablet Take by mouth.    . Insulin Glargine (TOUJEO SOLOSTAR) 300 UNIT/ML SOPN Inject 500 Units into the skin every morning. And pen needles 2/day 21 pen 11  . Insulin Syringe-Needle U-100 (B-D INS SYRINGE 2CC/29GX1/2") 29G X 1/2" 2 ML MISC Used for daily insulin injections 2x. 200 each 5  . levothyroxine (SYNTHROID, LEVOTHROID) 75 MCG tablet Take 1 tablet (75 mcg total) by mouth daily. 90 tablet 3  . lisinopril (PRINIVIL,ZESTRIL) 40 MG tablet TK 1 T PO D  5  . metFORMIN (GLUCOPHAGE) 1000 MG tablet Take 1,000 mg by mouth 2 (two) times daily with a meal.    . metoprolol tartrate (LOPRESSOR) 25 MG tablet   4  . Omega-3 1000 MG CAPS Take by mouth.    . potassium chloride (K-DUR,KLOR-CON) 10 MEQ tablet Take 10 mEq by mouth 2 (two) times daily. Reported on 11/26/2015    . PROAIR HFA  108 (90 Base) MCG/ACT inhaler INL 2 PFS PO Q 6 H PRN  5  . sildenafil (REVATIO) 20 MG tablet 2-5 tabs, as needed for ED symptoms 50 tablet 11  . sulfamethoxazole-trimethoprim (BACTRIM) 400-80 MG tablet Take 1 tablet by mouth 2 (two) times daily. 28 tablet 0   No current facility-administered medications on file prior to visit.    No Known Allergies  Objective: There were no vitals filed for this visit.  General: No acute distress, AAOx3  Right foot: I&D/Medial ankle ulceration with a mix of fibrogranular tissue and central blood clot that measures 5.2x 5.5cm with granular margins, lateral ankle ulcer measures 2.4 x 1.5 x0.3cm with fibrogranular tissue noted in the wound base, decreased cellulitis, decreased warmth, bloody drainage, minimal malodor from accumulated drainage, no acute signs of infection, capillary fill time <3 seconds in all digits, gross sensation present via light touch to right foot. Protective sensation absent. Decreased pain to leg, no pain to calf.  No pain with calf compression. Negative Homans sign.   Assessment and Plan:  Problem List Items Addressed This Visit    None    Visit Diagnoses    PVD (peripheral vascular disease) (HCC)    -  Primary   Ankle ulcer, right, with fat layer exposed (HCC)       Relevant Medications  oxyCODONE (OXY IR/ROXICODONE) 5 MG immediate release tablet   Venous ulcer of ankle, right (HCC)       Post-operative state       Relevant Medications   oxyCODONE (OXY IR/ROXICODONE) 5 MG immediate release tablet   Diabetic polyneuropathy associated with type 2 diabetes mellitus ([place])          -Patient seen and evaluated -Cleansed ulcerations. Applied PRISMA and dry sterile dressing to surgical site right foot secured with ACE wrap and stockinet as well as foot miracle cream to dry skin areas like previous.  -Advised patient to make sure to keep dressings clean, dry, and intact to right surgical site; dressings to be changed weekly by home  health using alginate and dry dressings on Mondays -IVR placed for Epifix to assist with wound healing  -Continue with post op shoe on right -Refilled Oxycodone -Advised patient to continue with post-op shoe on right and rolling walker/Cane for stability  -Advised patient to limit activity to necessity  -Will plan for continued wound care at next office visit and possible grafting when approved. In the meantime, patient to call office if any issues or problems arise.   Landis Martins, DPM

## 2020-02-24 ENCOUNTER — Telehealth: Payer: Self-pay | Admitting: *Deleted

## 2020-02-24 NOTE — Telephone Encounter (Signed)
-----   Message from [name], [place] sent at 02/23/2020 10:29 PM EDT ----- Regarding: Epifix IVR R venous ankle ulcers for in office application Contact  Barbara Cower [phone]

## 2020-02-24 NOTE — Telephone Encounter (Signed)
Left message [facility] - Conley Simmonds stating I would be faxing IVR on pt to his fax and the [facility] fax listed on the form, to call with any questions.

## 2020-02-27 DIAGNOSIS — J449 Chronic obstructive pulmonary disease, unspecified: Secondary | ICD-10-CM | POA: Diagnosis not present

## 2020-03-01 ENCOUNTER — Other Ambulatory Visit: Payer: Self-pay

## 2020-03-01 ENCOUNTER — Encounter: Payer: Self-pay | Admitting: Sports Medicine

## 2020-03-01 ENCOUNTER — Ambulatory Visit (INDEPENDENT_AMBULATORY_CARE_PROVIDER_SITE_OTHER): Payer: PPO | Admitting: Sports Medicine

## 2020-03-01 DIAGNOSIS — Z9889 Other specified postprocedural states: Secondary | ICD-10-CM

## 2020-03-01 DIAGNOSIS — L97312 Non-pressure chronic ulcer of right ankle with fat layer exposed: Secondary | ICD-10-CM | POA: Diagnosis not present

## 2020-03-01 MED ORDER — OXYCODONE HCL 5 MG PO TABS: 5.0000 mg | ORAL_TABLET | Freq: Four times a day (QID) | ORAL | 0 refills | Status: AC | PRN

## 2020-03-01 NOTE — Progress Notes (Signed)
Subjective: Scott Anderson is a 54 y.o. male patient seen today in office for POV # 10 (DOS 12-16-19), S/P R ankle I&D and wound debridement.  Reports that the nurse has been helping to change the dressing like before and he has not tried to look or pay it any attention, denies headache, chest pain, shortness of breath, nausea, vomiting, fever, or chills.  No other issues noted.   Fasting blood sugar 129.  Patient Active Problem List   Diagnosis Date Noted  . Pituitary insufficiency (HCC) 12/11/2015  . Hypogonadism male 11/29/2015  . Erectile dysfunction 11/26/2015  . Morbid obesity (HCC) 11/26/2015  . Diabetes (HCC) 11/26/2015  . HTN (hypertension) 11/26/2015  . Hypothyroidism 11/26/2015  . Anxiety state 11/26/2015  . Hypertriglyceridemia 11/26/2015    Current Outpatient Medications on File Prior to Visit  Medication Sig Dispense Refill  . clomiPHENE (CLOMID) 50 MG tablet Take 0.5 tablets (25 mg total) by mouth daily. 10 tablet 0  . diazepam (VALIUM) 5 MG tablet TK 1 T PO  TID PRN  0  . fenofibrate 160 MG tablet TK 1 T PO D  3  . gabapentin (NEURONTIN) 300 MG capsule 3 (three) times daily.   1  . gemfibrozil (LOPID) 600 MG tablet Take by mouth.    . Insulin Glargine (TOUJEO SOLOSTAR) 300 UNIT/ML SOPN Inject 500 Units into the skin every morning. And pen needles 2/day 21 pen 11  . Insulin Syringe-Needle U-100 (B-D INS SYRINGE 2CC/29GX1/2") 29G X 1/2" 2 ML MISC Used for daily insulin injections 2x. 200 each 5  . levothyroxine (SYNTHROID, LEVOTHROID) 75 MCG tablet Take 1 tablet (75 mcg total) by mouth daily. 90 tablet 3  . lisinopril (PRINIVIL,ZESTRIL) 40 MG tablet TK 1 T PO D  5  . metFORMIN (GLUCOPHAGE) 1000 MG tablet Take 1,000 mg by mouth 2 (two) times daily with a meal.    . metoprolol tartrate (LOPRESSOR) 25 MG tablet   4  . Omega-3 1000 MG CAPS Take by mouth.    . potassium chloride (K-DUR,KLOR-CON) 10 MEQ tablet Take 10 mEq by mouth 2 (two) times daily. Reported on 11/26/2015     . PROAIR HFA 108 (90 Base) MCG/ACT inhaler INL 2 PFS PO Q 6 H PRN  5  . sildenafil (REVATIO) 20 MG tablet 2-5 tabs, as needed for ED symptoms 50 tablet 11  . sulfamethoxazole-trimethoprim (BACTRIM) 400-80 MG tablet Take 1 tablet by mouth 2 (two) times daily. 28 tablet 0   No current facility-administered medications on file prior to visit.    No Known Allergies  Objective: There were no vitals filed for this visit.  General: No acute distress, AAOx3  Right foot: I&D/Medial ankle ulceration with a mix of fibrogranular tissue and central blood clot that measures 4.5x 5.0cm with granular margins, lateral ankle ulcer measures 1 x 1.5 x0.3cm with fibrogranular tissue noted in the wound base, decreased cellulitis, decreased warmth, bloody drainage, minimal malodor from accumulated drainage, no acute signs of infection, capillary fill time <3 seconds in all digits, gross sensation present via light touch to right foot. Protective sensation absent. Decreased pain to leg, no pain to calf.  No pain with calf compression. Negative Homans sign.   Assessment and Plan:  Problem List Items Addressed This Visit    None    Visit Diagnoses    Ankle ulcer, right, with fat layer exposed (HCC)       Relevant Medications   oxyCODONE (OXY IR/ROXICODONE) 5 MG immediate release tablet  Post-operative state       Relevant Medications   oxyCODONE (OXY IR/ROXICODONE) 5 MG immediate release tablet      -Patient seen and evaluated -Cleansed ulcerations. Applied PRISMA and dry sterile dressing to surgical site right foot secured with ACE wrap and stockinet as well as foot miracle cream to dry skin areas like previous.  -Advised patient to make sure to keep dressings clean, dry, and intact to right surgical site; dressings to be changed by home health using alginate and dry dressings on Monday and Thursday of next week since I will be out of office -IVR placed for Epifix to assist with wound healing; Awaiting  response -Continue with post op shoe on right -Refilled Oxycodone for severe ankle pain to take PRN -Advised patient to continue with post-op shoe on right and rolling walker/Cane for stability  -Advised patient to limit activity to necessity  -Will plan for continued wound care at next office visit and possible grafting when approved. In the meantime, patient to call office if any issues or problems arise.   [name], DPM

## 2020-03-15 ENCOUNTER — Ambulatory Visit (INDEPENDENT_AMBULATORY_CARE_PROVIDER_SITE_OTHER): Payer: PPO | Admitting: Sports Medicine

## 2020-03-15 ENCOUNTER — Other Ambulatory Visit: Payer: Self-pay

## 2020-03-15 DIAGNOSIS — L97319 Non-pressure chronic ulcer of right ankle with unspecified severity: Secondary | ICD-10-CM

## 2020-03-15 DIAGNOSIS — I739 Peripheral vascular disease, unspecified: Secondary | ICD-10-CM

## 2020-03-15 DIAGNOSIS — E1142 Type 2 diabetes mellitus with diabetic polyneuropathy: Secondary | ICD-10-CM

## 2020-03-15 DIAGNOSIS — L97312 Non-pressure chronic ulcer of right ankle with fat layer exposed: Secondary | ICD-10-CM

## 2020-03-15 DIAGNOSIS — L03115 Cellulitis of right lower limb: Secondary | ICD-10-CM

## 2020-03-15 DIAGNOSIS — L02415 Cutaneous abscess of right lower limb: Secondary | ICD-10-CM

## 2020-03-15 DIAGNOSIS — I83013 Varicose veins of right lower extremity with ulcer of ankle: Secondary | ICD-10-CM

## 2020-03-15 DIAGNOSIS — Z9889 Other specified postprocedural states: Secondary | ICD-10-CM

## 2020-03-16 ENCOUNTER — Encounter: Payer: Self-pay | Admitting: Sports Medicine

## 2020-03-16 MED ORDER — OXYCODONE HCL 5 MG PO TABS: 5.0000 mg | ORAL_TABLET | Freq: Four times a day (QID) | ORAL | 0 refills | Status: AC | PRN

## 2020-03-16 NOTE — Progress Notes (Signed)
Subjective: Scott Anderson is a 54 y.o. male patient seen today in office for POV # 11 (DOS 12-16-19), S/P R ankle I&D and wound debridement.  Reports that the nurse has been helping to change the dressing like before and thinks it is getting smaller, denies headache, chest pain, shortness of breath, nausea, vomiting, fever, or chills.  No other issues noted.   Fasting blood sugar 129 same as previous.  Patient Active Problem List   Diagnosis Date Noted   Pituitary insufficiency ([place]) 12/11/2015   Hypogonadism male 11/29/2015   Erectile dysfunction 11/26/2015   Morbid obesity ([place]) 11/26/2015   Diabetes ([place]) 11/26/2015   HTN (hypertension) 11/26/2015   Hypothyroidism 11/26/2015   Anxiety state 11/26/2015   Hypertriglyceridemia 11/26/2015    Current Outpatient Medications on File Prior to Visit  Medication Sig Dispense Refill   clomiPHENE (CLOMID) 50 MG tablet Take 0.5 tablets (25 mg total) by mouth daily. 10 tablet 0   diazepam (VALIUM) 5 MG tablet TK 1 T PO  TID PRN  0   fenofibrate 160 MG tablet TK 1 T PO D  3   gabapentin (NEURONTIN) 300 MG capsule 3 (three) times daily.   1   gemfibrozil (LOPID) 600 MG tablet Take by mouth.     Insulin Glargine (TOUJEO SOLOSTAR) 300 UNIT/ML SOPN Inject 500 Units into the skin every morning. And pen needles 2/day 21 pen 11   Insulin Syringe-Needle U-100 (B-D INS SYRINGE 2CC/29GX1/2") 29G X 1/2" 2 ML MISC Used for daily insulin injections 2x. 200 each 5   levothyroxine (SYNTHROID, LEVOTHROID) 75 MCG tablet Take 1 tablet (75 mcg total) by mouth daily. 90 tablet 3   lisinopril (PRINIVIL,ZESTRIL) 40 MG tablet TK 1 T PO D  5   metFORMIN (GLUCOPHAGE) 1000 MG tablet Take 1,000 mg by mouth 2 (two) times daily with a meal.     metoprolol tartrate (LOPRESSOR) 25 MG tablet   4   Omega-3 1000 MG CAPS Take by mouth.     potassium chloride (K-DUR,KLOR-CON) 10 MEQ tablet Take 10 mEq by mouth 2 (two) times daily. Reported on 11/26/2015      PROAIR HFA 108 (90 Base) MCG/ACT inhaler INL 2 PFS PO Q 6 H PRN  5   sildenafil (REVATIO) 20 MG tablet 2-5 tabs, as needed for ED symptoms 50 tablet 11   sulfamethoxazole-trimethoprim (BACTRIM) 400-80 MG tablet Take 1 tablet by mouth 2 (two) times daily. 28 tablet 0   No current facility-administered medications on file prior to visit.    No Known Allergies  Objective: There were no vitals filed for this visit.  General: No acute distress, AAOx3  Right foot: I&D/Medial ankle ulceration with a mix of fibrogranular tissue and central blood clot that measures 3.5 x 4.8 cm with granular margins, lateral ankle ulcer has prematurely healed and has a dry scab, decreased cellulitis, decreased warmth, bloody drainage, minimal malodor from accumulated drainage, no acute signs of infection, capillary fill time <3 seconds in all digits, gross sensation present via light touch to right foot. Protective sensation absent. Decreased pain to leg, no pain to calf.  No pain with calf compression. Negative Homans sign.   Assessment and Plan:  Problem List Items Addressed This Visit    None    Visit Diagnoses    Ankle ulcer, right, with fat layer exposed ([place])    -  Primary   Post-operative state       Venous ulcer of ankle, right ([place])  PVD (peripheral vascular disease) (HCC)       Diabetic polyneuropathy associated with type 2 diabetes mellitus ([place])       Cellulitis and abscess of right leg          -Patient seen and evaluated -Cleansed ulcerations. Applied PRISMA and dry sterile dressing to surgical site right foot secured with ACE wrap and stockinet as well as foot miracle cream to dry skin areas like previous.  -Advised patient to make sure to keep dressings clean, dry, and intact to right surgical site; dressings to be changed by home health using alginate and dry dressings like previous on Mondays -Discussed with patient that he has not met his out-of-pocket to qualify for a graft that is  totally covered so at this time we will continue with current wound care until the area has healed -Continue with surgical shoe -Refilled Oxycodone for severe ankle pain to take PRN -Advised patient to continue with post-op shoe on right and rolling walker/Cane for stability  -Advised patient to limit activity to necessity  -Will plan for continued wound care at next office visit or sooner if problems or issues arise. Landis Martins, DPM

## 2020-03-21 DIAGNOSIS — E1142 Type 2 diabetes mellitus with diabetic polyneuropathy: Secondary | ICD-10-CM | POA: Diagnosis not present

## 2020-03-21 DIAGNOSIS — E871 Hypo-osmolality and hyponatremia: Secondary | ICD-10-CM | POA: Diagnosis not present

## 2020-03-21 DIAGNOSIS — L03115 Cellulitis of right lower limb: Secondary | ICD-10-CM | POA: Diagnosis not present

## 2020-03-21 DIAGNOSIS — Z792 Long term (current) use of antibiotics: Secondary | ICD-10-CM | POA: Diagnosis not present

## 2020-03-21 DIAGNOSIS — E669 Obesity, unspecified: Secondary | ICD-10-CM | POA: Diagnosis not present

## 2020-03-21 DIAGNOSIS — Z794 Long term (current) use of insulin: Secondary | ICD-10-CM | POA: Diagnosis not present

## 2020-03-21 DIAGNOSIS — Z8631 Personal history of diabetic foot ulcer: Secondary | ICD-10-CM | POA: Diagnosis not present

## 2020-03-21 DIAGNOSIS — E039 Hypothyroidism, unspecified: Secondary | ICD-10-CM | POA: Diagnosis not present

## 2020-03-21 DIAGNOSIS — F419 Anxiety disorder, unspecified: Secondary | ICD-10-CM | POA: Diagnosis not present

## 2020-03-21 DIAGNOSIS — L02611 Cutaneous abscess of right foot: Secondary | ICD-10-CM | POA: Diagnosis not present

## 2020-03-21 DIAGNOSIS — N179 Acute kidney failure, unspecified: Secondary | ICD-10-CM | POA: Diagnosis not present

## 2020-03-21 DIAGNOSIS — Z452 Encounter for adjustment and management of vascular access device: Secondary | ICD-10-CM | POA: Diagnosis not present

## 2020-03-21 DIAGNOSIS — Z79891 Long term (current) use of opiate analgesic: Secondary | ICD-10-CM | POA: Diagnosis not present

## 2020-03-21 DIAGNOSIS — Z79890 Hormone replacement therapy (postmenopausal): Secondary | ICD-10-CM | POA: Diagnosis not present

## 2020-03-21 DIAGNOSIS — I509 Heart failure, unspecified: Secondary | ICD-10-CM | POA: Diagnosis not present

## 2020-03-21 DIAGNOSIS — E7800 Pure hypercholesterolemia, unspecified: Secondary | ICD-10-CM | POA: Diagnosis not present

## 2020-03-21 DIAGNOSIS — I110 Hypertensive heart disease with heart failure: Secondary | ICD-10-CM | POA: Diagnosis not present

## 2020-03-21 DIAGNOSIS — Z791 Long term (current) use of non-steroidal anti-inflammatories (NSAID): Secondary | ICD-10-CM | POA: Diagnosis not present

## 2020-03-21 DIAGNOSIS — Z7982 Long term (current) use of aspirin: Secondary | ICD-10-CM | POA: Diagnosis not present

## 2020-03-21 DIAGNOSIS — B951 Streptococcus, group B, as the cause of diseases classified elsewhere: Secondary | ICD-10-CM | POA: Diagnosis not present

## 2020-03-21 DIAGNOSIS — M1991 Primary osteoarthritis, unspecified site: Secondary | ICD-10-CM | POA: Diagnosis not present

## 2020-03-21 DIAGNOSIS — F329 Major depressive disorder, single episode, unspecified: Secondary | ICD-10-CM | POA: Diagnosis not present

## 2020-03-21 DIAGNOSIS — E1165 Type 2 diabetes mellitus with hyperglycemia: Secondary | ICD-10-CM | POA: Diagnosis not present

## 2020-03-21 DIAGNOSIS — D72829 Elevated white blood cell count, unspecified: Secondary | ICD-10-CM | POA: Diagnosis not present

## 2020-03-22 ENCOUNTER — Other Ambulatory Visit: Payer: Self-pay

## 2020-03-22 ENCOUNTER — Ambulatory Visit (INDEPENDENT_AMBULATORY_CARE_PROVIDER_SITE_OTHER): Payer: PPO | Admitting: Sports Medicine

## 2020-03-22 ENCOUNTER — Encounter: Payer: Self-pay | Admitting: Sports Medicine

## 2020-03-22 DIAGNOSIS — I83013 Varicose veins of right lower extremity with ulcer of ankle: Secondary | ICD-10-CM | POA: Diagnosis not present

## 2020-03-22 DIAGNOSIS — L97312 Non-pressure chronic ulcer of right ankle with fat layer exposed: Secondary | ICD-10-CM

## 2020-03-22 DIAGNOSIS — L97319 Non-pressure chronic ulcer of right ankle with unspecified severity: Secondary | ICD-10-CM

## 2020-03-22 DIAGNOSIS — Z9889 Other specified postprocedural states: Secondary | ICD-10-CM | POA: Diagnosis not present

## 2020-03-22 DIAGNOSIS — I739 Peripheral vascular disease, unspecified: Secondary | ICD-10-CM | POA: Diagnosis not present

## 2020-03-22 NOTE — Progress Notes (Signed)
Subjective: Scott Anderson is a 54 y.o. male patient seen today in office for POV # 12 (DOS 12-16-19), S/P R ankle I&D and wound debridement.  Reports that the nurse has been helping to change the dressing and doing better, denies headache, chest pain, shortness of breath, nausea, vomiting, fever, or chills.  No other issues noted.   Fasting blood sugar 133.   Patient Active Problem List   Diagnosis Date Noted  . Pituitary insufficiency (HCC) 12/11/2015  . Hypogonadism male 11/29/2015  . Erectile dysfunction 11/26/2015  . Morbid obesity (HCC) 11/26/2015  . Diabetes (HCC) 11/26/2015  . HTN (hypertension) 11/26/2015  . Hypothyroidism 11/26/2015  . Anxiety state 11/26/2015  . Hypertriglyceridemia 11/26/2015    Current Outpatient Medications on File Prior to Visit  Medication Sig Dispense Refill  . clomiPHENE (CLOMID) 50 MG tablet Take 0.5 tablets (25 mg total) by mouth daily. 10 tablet 0  . diazepam (VALIUM) 5 MG tablet TK 1 T PO  TID PRN  0  . fenofibrate 160 MG tablet TK 1 T PO D  3  . gabapentin (NEURONTIN) 300 MG capsule 3 (three) times daily.   1  . gemfibrozil (LOPID) 600 MG tablet Take by mouth.    . Insulin Glargine (TOUJEO SOLOSTAR) 300 UNIT/ML SOPN Inject 500 Units into the skin every morning. And pen needles 2/day 21 pen 11  . Insulin Syringe-Needle U-100 (B-D INS SYRINGE 2CC/29GX1/2") 29G X 1/2" 2 ML MISC Used for daily insulin injections 2x. 200 each 5  . levothyroxine (SYNTHROID, LEVOTHROID) 75 MCG tablet Take 1 tablet (75 mcg total) by mouth daily. 90 tablet 3  . lisinopril (PRINIVIL,ZESTRIL) 40 MG tablet TK 1 T PO D  5  . metFORMIN (GLUCOPHAGE) 1000 MG tablet Take 1,000 mg by mouth 2 (two) times daily with a meal.    . metoprolol tartrate (LOPRESSOR) 25 MG tablet   4  . Omega-3 1000 MG CAPS Take by mouth.    . oxyCODONE (OXY IR/ROXICODONE) 5 MG immediate release tablet Take 1 tablet (5 mg total) by mouth every 6 (six) hours as needed for up to 7 days for severe pain. 28  tablet 0  . potassium chloride (K-DUR,KLOR-CON) 10 MEQ tablet Take 10 mEq by mouth 2 (two) times daily. Reported on 11/26/2015    . PROAIR HFA 108 (90 Base) MCG/ACT inhaler INL 2 PFS PO Q 6 H PRN  5  . sildenafil (REVATIO) 20 MG tablet 2-5 tabs, as needed for ED symptoms 50 tablet 11  . sulfamethoxazole-trimethoprim (BACTRIM) 400-80 MG tablet Take 1 tablet by mouth 2 (two) times daily. 28 tablet 0   No current facility-administered medications on file prior to visit.    No Known Allergies  Objective: There were no vitals filed for this visit.  General: No acute distress, AAOx3  Right foot: I&D/Medial ankle ulceration with a mix of fibrogranular tissue and central blood clot that measures 3.5 x 4.5 cm with granular margins, smaller in size, decreased cellulitis, decreased warmth, bloody drainage, minimal malodor from accumulated drainage, no acute signs of infection, capillary fill time <3 seconds in all digits, gross sensation present via light touch to right foot. Protective sensation absent. Decreased pain to leg, no pain to calf.  No pain with calf compression. Negative Homans sign.   Assessment and Plan:  Problem List Items Addressed This Visit    None    Visit Diagnoses    Ankle ulcer, right, with fat layer exposed (HCC)    -  Primary   Venous ulcer of ankle, right (HCC)       Post-operative state       PVD (peripheral vascular disease) (HCC)          -Patient seen and evaluated -Cleansed ulcerations, applied PRISMA and dry sterile dressing to surgical site right foot secured with ACE wrap and stockinet as well as foot miracle cream to dry skin areas like previous.  -Advised patient to make sure to keep dressings clean, dry, and intact to right surgical site; dressings to be changed by home health using alginate and dry dressings like previous on Mondays -Continue with surgical shoe -Refilled Oxycodone for severe ankle pain to take PRN -Will plan for continued wound care at next  office visit or sooner if problems or issues arise. [name], DPM

## 2020-03-28 DIAGNOSIS — J449 Chronic obstructive pulmonary disease, unspecified: Secondary | ICD-10-CM | POA: Diagnosis not present

## 2020-03-29 ENCOUNTER — Ambulatory Visit (INDEPENDENT_AMBULATORY_CARE_PROVIDER_SITE_OTHER): Payer: PPO | Admitting: Sports Medicine

## 2020-03-29 ENCOUNTER — Other Ambulatory Visit: Payer: Self-pay

## 2020-03-29 ENCOUNTER — Encounter: Payer: Self-pay | Admitting: Sports Medicine

## 2020-03-29 DIAGNOSIS — I83013 Varicose veins of right lower extremity with ulcer of ankle: Secondary | ICD-10-CM

## 2020-03-29 DIAGNOSIS — Z9889 Other specified postprocedural states: Secondary | ICD-10-CM

## 2020-03-29 DIAGNOSIS — L97319 Non-pressure chronic ulcer of right ankle with unspecified severity: Secondary | ICD-10-CM | POA: Diagnosis not present

## 2020-03-29 DIAGNOSIS — I739 Peripheral vascular disease, unspecified: Secondary | ICD-10-CM

## 2020-03-29 DIAGNOSIS — L97312 Non-pressure chronic ulcer of right ankle with fat layer exposed: Secondary | ICD-10-CM | POA: Diagnosis not present

## 2020-03-29 DIAGNOSIS — E1142 Type 2 diabetes mellitus with diabetic polyneuropathy: Secondary | ICD-10-CM

## 2020-03-29 MED ORDER — OXYCODONE HCL 5 MG PO TABS: 5.0000 mg | ORAL_TABLET | Freq: Four times a day (QID) | ORAL | 0 refills | Status: AC | PRN

## 2020-03-29 NOTE — Progress Notes (Signed)
Subjective: Scott Anderson is a 54 y.o. male patient seen today in office for POV # 13 (DOS 12-16-19), S/P R ankle I&D and wound debridement.  Reports that the nurse thinks it is getting smaller, denies headache, chest pain, shortness of breath, nausea, vomiting, fever, or chills.  No other issues noted.   Fasting blood sugar 149.   Patient Active Problem List   Diagnosis Date Noted  . Pituitary insufficiency (HCC) 12/11/2015  . Hypogonadism male 11/29/2015  . Erectile dysfunction 11/26/2015  . Morbid obesity (HCC) 11/26/2015  . Diabetes (HCC) 11/26/2015  . HTN (hypertension) 11/26/2015  . Hypothyroidism 11/26/2015  . Anxiety state 11/26/2015  . Hypertriglyceridemia 11/26/2015    Current Outpatient Medications on File Prior to Visit  Medication Sig Dispense Refill  . clomiPHENE (CLOMID) 50 MG tablet Take 0.5 tablets (25 mg total) by mouth daily. 10 tablet 0  . diazepam (VALIUM) 5 MG tablet TK 1 T PO  TID PRN  0  . fenofibrate 160 MG tablet TK 1 T PO D  3  . gabapentin (NEURONTIN) 300 MG capsule 3 (three) times daily.   1  . gemfibrozil (LOPID) 600 MG tablet Take by mouth.    . Insulin Glargine (TOUJEO SOLOSTAR) 300 UNIT/ML SOPN Inject 500 Units into the skin every morning. And pen needles 2/day 21 pen 11  . Insulin Syringe-Needle U-100 (B-D INS SYRINGE 2CC/29GX1/2") 29G X 1/2" 2 ML MISC Used for daily insulin injections 2x. 200 each 5  . levothyroxine (SYNTHROID, LEVOTHROID) 75 MCG tablet Take 1 tablet (75 mcg total) by mouth daily. 90 tablet 3  . lisinopril (PRINIVIL,ZESTRIL) 40 MG tablet TK 1 T PO D  5  . metFORMIN (GLUCOPHAGE) 1000 MG tablet Take 1,000 mg by mouth 2 (two) times daily with a meal.    . metoprolol tartrate (LOPRESSOR) 25 MG tablet   4  . Omega-3 1000 MG CAPS Take by mouth.    . potassium chloride (K-DUR,KLOR-CON) 10 MEQ tablet Take 10 mEq by mouth 2 (two) times daily. Reported on 11/26/2015    . PROAIR HFA 108 (90 Base) MCG/ACT inhaler INL 2 PFS PO Q 6 H PRN  5  .  sildenafil (REVATIO) 20 MG tablet 2-5 tabs, as needed for ED symptoms 50 tablet 11  . sulfamethoxazole-trimethoprim (BACTRIM) 400-80 MG tablet Take 1 tablet by mouth 2 (two) times daily. 28 tablet 0   No current facility-administered medications on file prior to visit.    No Known Allergies  Objective: There were no vitals filed for this visit.  General: No acute distress, AAOx3  Right foot: I&D/Medial ankle ulceration with a mix of fibrogranular tissue and central blood clot that measures 3.0 x 3.8 cm with granular margins, smaller in size, decreased cellulitis, decreased warmth, bloody drainage, minimal malodor from accumulated drainage, no acute signs of infection, capillary fill time <3 seconds in all digits, gross sensation present via light touch to right foot. Protective sensation absent. Decreased pain to leg, no pain to calf.  No pain with calf compression. Negative Homans sign.   Assessment and Plan:  Problem List Items Addressed This Visit    None    Visit Diagnoses    Post-operative state    -  Primary   Relevant Medications   oxyCODONE (OXY IR/ROXICODONE) 5 MG immediate release tablet   Ankle ulcer, right, with fat layer exposed (HCC)       Venous ulcer of ankle, right (HCC)       PVD (peripheral vascular  disease) (HCC)       Diabetic polyneuropathy associated with type 2 diabetes mellitus (HCC)          -Patient seen and evaluated -Cleansed ulcerations, applied PRISMA and dry sterile dressing to surgical site right foot secured with ACE wrap and stockinet as well as foot miracle cream to dry skin areas like previous.  -Advised patient to make sure to keep dressings clean, dry, and intact to right surgical site; dressings to be changed by home health using alginate and dry dressings like previous on Mondays -We will check again to see if his insurance has approved EPIFIX -Continue with surgical shoe -Refilled Oxycodone for severe ankle pain to take PRN; refill sent prior  to patient leaving the office today since last visit he said he did not get it, pharmacy did not have it -Will plan for continued wound care at next office visit or sooner if problems or issues arise. [name], DPM

## 2020-04-06 ENCOUNTER — Encounter: Payer: Self-pay | Admitting: Sports Medicine

## 2020-04-06 ENCOUNTER — Ambulatory Visit (INDEPENDENT_AMBULATORY_CARE_PROVIDER_SITE_OTHER): Payer: PPO | Admitting: Sports Medicine

## 2020-04-06 ENCOUNTER — Encounter: Payer: PPO | Admitting: Sports Medicine

## 2020-04-06 ENCOUNTER — Other Ambulatory Visit: Payer: Self-pay

## 2020-04-06 DIAGNOSIS — I83013 Varicose veins of right lower extremity with ulcer of ankle: Secondary | ICD-10-CM

## 2020-04-06 DIAGNOSIS — Z9889 Other specified postprocedural states: Secondary | ICD-10-CM | POA: Diagnosis not present

## 2020-04-06 DIAGNOSIS — I739 Peripheral vascular disease, unspecified: Secondary | ICD-10-CM | POA: Diagnosis not present

## 2020-04-06 DIAGNOSIS — E1142 Type 2 diabetes mellitus with diabetic polyneuropathy: Secondary | ICD-10-CM | POA: Diagnosis not present

## 2020-04-06 DIAGNOSIS — L97312 Non-pressure chronic ulcer of right ankle with fat layer exposed: Secondary | ICD-10-CM

## 2020-04-06 DIAGNOSIS — L97319 Non-pressure chronic ulcer of right ankle with unspecified severity: Secondary | ICD-10-CM | POA: Diagnosis not present

## 2020-04-06 NOTE — Progress Notes (Addendum)
Subjective: RODERIC LAMMERT is a 54 y.o. male patient seen today in office for POV # 14 (DOS 12-16-19), S/P R ankle I&D and wound debridement.  Reports that everything seems to be the same, denies headache, chest pain, shortness of breath, nausea, vomiting, fever, or chills.  No other issues noted.   Fasting blood sugar not recorded today.  Patient Active Problem List   Diagnosis Date Noted  . Pituitary insufficiency (HCC) 12/11/2015  . Hypogonadism male 11/29/2015  . Erectile dysfunction 11/26/2015  . Morbid obesity (HCC) 11/26/2015  . Diabetes (HCC) 11/26/2015  . HTN (hypertension) 11/26/2015  . Hypothyroidism 11/26/2015  . Anxiety state 11/26/2015  . Hypertriglyceridemia 11/26/2015    Current Outpatient Medications on File Prior to Visit  Medication Sig Dispense Refill  . clomiPHENE (CLOMID) 50 MG tablet Take 0.5 tablets (25 mg total) by mouth daily. 10 tablet 0  . diazepam (VALIUM) 5 MG tablet TK 1 T PO  TID PRN  0  . fenofibrate 160 MG tablet TK 1 T PO D  3  . gabapentin (NEURONTIN) 300 MG capsule 3 (three) times daily.   1  . gemfibrozil (LOPID) 600 MG tablet Take by mouth.    . Insulin Glargine (TOUJEO SOLOSTAR) 300 UNIT/ML SOPN Inject 500 Units into the skin every morning. And pen needles 2/day 21 pen 11  . Insulin Syringe-Needle U-100 (B-D INS SYRINGE 2CC/29GX1/2") 29G X 1/2" 2 ML MISC Used for daily insulin injections 2x. 200 each 5  . levothyroxine (SYNTHROID, LEVOTHROID) 75 MCG tablet Take 1 tablet (75 mcg total) by mouth daily. 90 tablet 3  . lisinopril (PRINIVIL,ZESTRIL) 40 MG tablet TK 1 T PO D  5  . metFORMIN (GLUCOPHAGE) 1000 MG tablet Take 1,000 mg by mouth 2 (two) times daily with a meal.    . metoprolol tartrate (LOPRESSOR) 25 MG tablet   4  . Omega-3 1000 MG CAPS Take by mouth.    . potassium chloride (K-DUR,KLOR-CON) 10 MEQ tablet Take 10 mEq by mouth 2 (two) times daily. Reported on 11/26/2015    . PROAIR HFA 108 (90 Base) MCG/ACT inhaler INL 2 PFS PO Q 6 H PRN  5   . sildenafil (REVATIO) 20 MG tablet 2-5 tabs, as needed for ED symptoms 50 tablet 11  . sulfamethoxazole-trimethoprim (BACTRIM) 400-80 MG tablet Take 1 tablet by mouth 2 (two) times daily. 28 tablet 0   No current facility-administered medications on file prior to visit.    No Known Allergies  Objective: There were no vitals filed for this visit.  General: No acute distress, AAOx3  Right foot: I&D/Medial ankle ulceration with a mix of fibrogranular tissue and central blood clot that measures 2.0 x 3.9 cm with granular margins, smaller in size, decreased cellulitis, decreased warmth, bloody drainage, minimal malodor from accumulated drainage, no acute signs of infection, capillary fill time <3 seconds in all digits, gross sensation present via light touch to right foot. Protective sensation absent. Decreased pain to leg, no pain to calf.  No pain with calf compression. Negative Homans sign.   Assessment and Plan:  Problem List Items Addressed This Visit    None    Visit Diagnoses    Ankle ulcer, right, with fat layer exposed (HCC)    -  Primary   Venous ulcer of ankle, right (HCC)       Post-operative state       PVD (peripheral vascular disease) (HCC)       Diabetic polyneuropathy associated with type 2  diabetes mellitus (HCC)          -Patient seen and evaluated -Cleansed ulcerations, applied PRISMA and dry sterile dressing to surgical site right foot secured with ACE wrap and stockinet as well as foot miracle cream to dry skin areas like previous.  -Rep explained from epifix the benefits of advanced biologic however at this time we will hold off on applying since he is making progress with PRISMA -Advised patient to make sure to keep dressings clean, dry, and intact to right surgical site; dressings to be changed by home health using alginate and dry dressings like previous on Mondays or Tuesdays since we have changed our schedule in office and seeing him on Fridays -Continue with  surgical shoe; dispensed a new shoe at this visit and continue with cane to assist with ambulation -Patient has a refill available for his ankle pain to pick up from his pharmacy -Will plan for continued wound care at next office visit or sooner if problems or issues arise. [name], DPM

## 2020-04-12 ENCOUNTER — Other Ambulatory Visit: Payer: Self-pay | Admitting: Sports Medicine

## 2020-04-12 ENCOUNTER — Telehealth: Payer: Self-pay

## 2020-04-12 MED ORDER — OXYCODONE HCL 5 MG PO TABS: 5.0000 mg | ORAL_TABLET | Freq: Four times a day (QID) | ORAL | 0 refills | Status: AC | PRN

## 2020-04-12 NOTE — Progress Notes (Signed)
Refilled pain med 

## 2020-04-12 NOTE — Telephone Encounter (Signed)
Sent!

## 2020-04-12 NOTE — Telephone Encounter (Signed)
PT called stating his pharmacy did not received any order for his pain medication RF. He says if Dr. Marylene Land can resend the RF to [facility]

## 2020-04-13 ENCOUNTER — Encounter: Payer: Self-pay | Admitting: Sports Medicine

## 2020-04-13 ENCOUNTER — Ambulatory Visit (INDEPENDENT_AMBULATORY_CARE_PROVIDER_SITE_OTHER): Payer: PPO | Admitting: Sports Medicine

## 2020-04-13 ENCOUNTER — Encounter: Payer: PPO | Admitting: Sports Medicine

## 2020-04-13 ENCOUNTER — Other Ambulatory Visit: Payer: Self-pay

## 2020-04-13 DIAGNOSIS — L97312 Non-pressure chronic ulcer of right ankle with fat layer exposed: Secondary | ICD-10-CM | POA: Diagnosis not present

## 2020-04-13 DIAGNOSIS — I83013 Varicose veins of right lower extremity with ulcer of ankle: Secondary | ICD-10-CM

## 2020-04-13 DIAGNOSIS — Z9889 Other specified postprocedural states: Secondary | ICD-10-CM

## 2020-04-13 DIAGNOSIS — E1142 Type 2 diabetes mellitus with diabetic polyneuropathy: Secondary | ICD-10-CM | POA: Diagnosis not present

## 2020-04-13 DIAGNOSIS — L97319 Non-pressure chronic ulcer of right ankle with unspecified severity: Secondary | ICD-10-CM | POA: Diagnosis not present

## 2020-04-13 DIAGNOSIS — I739 Peripheral vascular disease, unspecified: Secondary | ICD-10-CM | POA: Diagnosis not present

## 2020-04-13 NOTE — Progress Notes (Signed)
Subjective: Scott Anderson is a 53 y.o. male patient seen today in office for POV #15 (DOS 12-16-19), S/P R ankle I&D and wound debridement.  Reports that he guess that he is doing well, denies headache, chest pain, shortness of breath, nausea, vomiting, fever, or chills.  No other issues noted.   Fasting blood sugar yesterday was 148mg /dl.   Patient Active Problem List   Diagnosis Date Noted  . Pituitary insufficiency (HCC) 12/11/2015  . Hypogonadism male 11/29/2015  . Erectile dysfunction 11/26/2015  . Morbid obesity (HCC) 11/26/2015  . Diabetes (HCC) 11/26/2015  . HTN (hypertension) 11/26/2015  . Hypothyroidism 11/26/2015  . Anxiety state 11/26/2015  . Hypertriglyceridemia 11/26/2015    Current Outpatient Medications on File Prior to Visit  Medication Sig Dispense Refill  . clomiPHENE (CLOMID) 50 MG tablet Take 0.5 tablets (25 mg total) by mouth daily. 10 tablet 0  . diazepam (VALIUM) 5 MG tablet TK 1 T PO  TID PRN  0  . fenofibrate 160 MG tablet TK 1 T PO D  3  . gabapentin (NEURONTIN) 300 MG capsule 3 (three) times daily.   1  . gemfibrozil (LOPID) 600 MG tablet Take by mouth.    . Insulin Glargine (TOUJEO SOLOSTAR) 300 UNIT/ML SOPN Inject 500 Units into the skin every morning. And pen needles 2/day 21 pen 11  . Insulin Syringe-Needle U-100 (B-D INS SYRINGE 2CC/29GX1/2") 29G X 1/2" 2 ML MISC Used for daily insulin injections 2x. 200 each 5  . levothyroxine (SYNTHROID, LEVOTHROID) 75 MCG tablet Take 1 tablet (75 mcg total) by mouth daily. 90 tablet 3  . lisinopril (PRINIVIL,ZESTRIL) 40 MG tablet TK 1 T PO D  5  . metFORMIN (GLUCOPHAGE) 1000 MG tablet Take 1,000 mg by mouth 2 (two) times daily with a meal.    . metoprolol tartrate (LOPRESSOR) 25 MG tablet   4  . Omega-3 1000 MG CAPS Take by mouth.    . oxyCODONE (OXY IR/ROXICODONE) 5 MG immediate release tablet Take 1 tablet (5 mg total) by mouth every 6 (six) hours as needed for up to 7 days for severe pain. 28 tablet 0  .  potassium chloride (K-DUR,KLOR-CON) 10 MEQ tablet Take 10 mEq by mouth 2 (two) times daily. Reported on 11/26/2015    . PROAIR HFA 108 (90 Base) MCG/ACT inhaler INL 2 PFS PO Q 6 H PRN  5  . sildenafil (REVATIO) 20 MG tablet 2-5 tabs, as needed for ED symptoms 50 tablet 11  . sulfamethoxazole-trimethoprim (BACTRIM) 400-80 MG tablet Take 1 tablet by mouth 2 (two) times daily. 28 tablet 0   No current facility-administered medications on file prior to visit.    No Known Allergies  Objective: There were no vitals filed for this visit.  General: No acute distress, AAOx3  Right foot: I&D/Medial ankle ulceration with a mix of fibrogranular tissue and central blood clot that measures 1.0 x 3.0 cm with granular margins, smaller in size, decreased cellulitis, decreased warmth, bloody drainage, minimal malodor from accumulated drainage, no acute signs of infection, capillary fill time <3 seconds in all digits, gross sensation present via light touch to right foot. Protective sensation absent. Decreased pain to leg, no pain to calf.  No pain with calf compression. Negative Homans sign.   Assessment and Plan:  Problem List Items Addressed This Visit    None    Visit Diagnoses    Ankle ulcer, right, with fat layer exposed (HCC)    -  Primary   Venous  ulcer of ankle, right (HCC)       Post-operative state       PVD (peripheral vascular disease) (HCC)       Diabetic polyneuropathy associated with type 2 diabetes mellitus (HCC)          -Patient seen and evaluated -Cleansed ulcerations, applied PRISMA and dry sterile dressing to surgical site right foot secured with ACE wrap and stockinet as well as foot miracle cream to dry skin areas like previous.  -Advised patient to make sure to keep dressings clean, dry, and intact to right surgical site; dressings to be changed by home health using alginate and dry dressings like previous on Mondays or Tuesdays/ once a week -Continue with surgical shoe -Continue  with Oxycodone for pain  -Return for continued wound care at next office visit or sooner if problems or issues arise. [name], DPM

## 2020-04-20 ENCOUNTER — Other Ambulatory Visit: Payer: Self-pay

## 2020-04-20 ENCOUNTER — Ambulatory Visit (INDEPENDENT_AMBULATORY_CARE_PROVIDER_SITE_OTHER): Payer: PPO | Admitting: Sports Medicine

## 2020-04-20 ENCOUNTER — Encounter: Payer: Self-pay | Admitting: Sports Medicine

## 2020-04-20 DIAGNOSIS — E1142 Type 2 diabetes mellitus with diabetic polyneuropathy: Secondary | ICD-10-CM

## 2020-04-20 DIAGNOSIS — L02611 Cutaneous abscess of right foot: Secondary | ICD-10-CM | POA: Diagnosis not present

## 2020-04-20 DIAGNOSIS — D72829 Elevated white blood cell count, unspecified: Secondary | ICD-10-CM | POA: Diagnosis not present

## 2020-04-20 DIAGNOSIS — Z79891 Long term (current) use of opiate analgesic: Secondary | ICD-10-CM | POA: Diagnosis not present

## 2020-04-20 DIAGNOSIS — N179 Acute kidney failure, unspecified: Secondary | ICD-10-CM | POA: Diagnosis not present

## 2020-04-20 DIAGNOSIS — E039 Hypothyroidism, unspecified: Secondary | ICD-10-CM | POA: Diagnosis not present

## 2020-04-20 DIAGNOSIS — M1991 Primary osteoarthritis, unspecified site: Secondary | ICD-10-CM | POA: Diagnosis not present

## 2020-04-20 DIAGNOSIS — L97319 Non-pressure chronic ulcer of right ankle with unspecified severity: Secondary | ICD-10-CM

## 2020-04-20 DIAGNOSIS — F419 Anxiety disorder, unspecified: Secondary | ICD-10-CM | POA: Diagnosis not present

## 2020-04-20 DIAGNOSIS — L97312 Non-pressure chronic ulcer of right ankle with fat layer exposed: Secondary | ICD-10-CM

## 2020-04-20 DIAGNOSIS — Z79890 Hormone replacement therapy (postmenopausal): Secondary | ICD-10-CM | POA: Diagnosis not present

## 2020-04-20 DIAGNOSIS — E871 Hypo-osmolality and hyponatremia: Secondary | ICD-10-CM | POA: Diagnosis not present

## 2020-04-20 DIAGNOSIS — B951 Streptococcus, group B, as the cause of diseases classified elsewhere: Secondary | ICD-10-CM | POA: Diagnosis not present

## 2020-04-20 DIAGNOSIS — Z8631 Personal history of diabetic foot ulcer: Secondary | ICD-10-CM | POA: Diagnosis not present

## 2020-04-20 DIAGNOSIS — E1165 Type 2 diabetes mellitus with hyperglycemia: Secondary | ICD-10-CM | POA: Diagnosis not present

## 2020-04-20 DIAGNOSIS — I739 Peripheral vascular disease, unspecified: Secondary | ICD-10-CM

## 2020-04-20 DIAGNOSIS — Z791 Long term (current) use of non-steroidal anti-inflammatories (NSAID): Secondary | ICD-10-CM | POA: Diagnosis not present

## 2020-04-20 DIAGNOSIS — M25571 Pain in right ankle and joints of right foot: Secondary | ICD-10-CM

## 2020-04-20 DIAGNOSIS — E7800 Pure hypercholesterolemia, unspecified: Secondary | ICD-10-CM | POA: Diagnosis not present

## 2020-04-20 DIAGNOSIS — Z792 Long term (current) use of antibiotics: Secondary | ICD-10-CM | POA: Diagnosis not present

## 2020-04-20 DIAGNOSIS — Z9889 Other specified postprocedural states: Secondary | ICD-10-CM

## 2020-04-20 DIAGNOSIS — F329 Major depressive disorder, single episode, unspecified: Secondary | ICD-10-CM | POA: Diagnosis not present

## 2020-04-20 DIAGNOSIS — L03115 Cellulitis of right lower limb: Secondary | ICD-10-CM | POA: Diagnosis not present

## 2020-04-20 DIAGNOSIS — Z7982 Long term (current) use of aspirin: Secondary | ICD-10-CM | POA: Diagnosis not present

## 2020-04-20 DIAGNOSIS — I110 Hypertensive heart disease with heart failure: Secondary | ICD-10-CM | POA: Diagnosis not present

## 2020-04-20 DIAGNOSIS — I509 Heart failure, unspecified: Secondary | ICD-10-CM | POA: Diagnosis not present

## 2020-04-20 DIAGNOSIS — I83013 Varicose veins of right lower extremity with ulcer of ankle: Secondary | ICD-10-CM

## 2020-04-20 DIAGNOSIS — E669 Obesity, unspecified: Secondary | ICD-10-CM | POA: Diagnosis not present

## 2020-04-20 DIAGNOSIS — Z794 Long term (current) use of insulin: Secondary | ICD-10-CM | POA: Diagnosis not present

## 2020-04-20 MED ORDER — OXYCODONE HCL 5 MG PO TABS: 5.0000 mg | ORAL_TABLET | Freq: Four times a day (QID) | ORAL | 0 refills | Status: DC | PRN

## 2020-04-20 NOTE — Progress Notes (Signed)
Subjective: Scott Anderson is a 54 y.o. male patient seen today in office for POV #16 (DOS 12-16-19), S/P R ankle I&D and wound debridement.  Reports that he has a little more pain that usual 5/10, denies headache, chest pain, shortness of breath, nausea, vomiting, fever, or chills.  No other issues noted.   Fasting blood sugar yesterday was 129mg /dl.   Patient Active Problem List   Diagnosis Date Noted  . Pituitary insufficiency (HCC) 12/11/2015  . Hypogonadism male 11/29/2015  . Erectile dysfunction 11/26/2015  . Morbid obesity (HCC) 11/26/2015  . Diabetes (HCC) 11/26/2015  . HTN (hypertension) 11/26/2015  . Hypothyroidism 11/26/2015  . Anxiety state 11/26/2015  . Hypertriglyceridemia 11/26/2015    Current Outpatient Medications on File Prior to Visit  Medication Sig Dispense Refill  . clomiPHENE (CLOMID) 50 MG tablet Take 0.5 tablets (25 mg total) by mouth daily. 10 tablet 0  . diazepam (VALIUM) 5 MG tablet TK 1 T PO  TID PRN  0  . fenofibrate 160 MG tablet TK 1 T PO D  3  . gabapentin (NEURONTIN) 300 MG capsule 3 (three) times daily.   1  . gemfibrozil (LOPID) 600 MG tablet Take by mouth.    . Insulin Glargine (TOUJEO SOLOSTAR) 300 UNIT/ML SOPN Inject 500 Units into the skin every morning. And pen needles 2/day 21 pen 11  . Insulin Syringe-Needle U-100 (B-D INS SYRINGE 2CC/29GX1/2") 29G X 1/2" 2 ML MISC Used for daily insulin injections 2x. 200 each 5  . levothyroxine (SYNTHROID, LEVOTHROID) 75 MCG tablet Take 1 tablet (75 mcg total) by mouth daily. 90 tablet 3  . lisinopril (PRINIVIL,ZESTRIL) 40 MG tablet TK 1 T PO D  5  . metFORMIN (GLUCOPHAGE) 1000 MG tablet Take 1,000 mg by mouth 2 (two) times daily with a meal.    . metoprolol tartrate (LOPRESSOR) 25 MG tablet   4  . Omega-3 1000 MG CAPS Take by mouth.    . potassium chloride (K-DUR,KLOR-CON) 10 MEQ tablet Take 10 mEq by mouth 2 (two) times daily. Reported on 11/26/2015    . PROAIR HFA 108 (90 Base) MCG/ACT inhaler INL 2 PFS  PO Q 6 H PRN  5  . sildenafil (REVATIO) 20 MG tablet 2-5 tabs, as needed for ED symptoms 50 tablet 11  . sulfamethoxazole-trimethoprim (BACTRIM) 400-80 MG tablet Take 1 tablet by mouth 2 (two) times daily. 28 tablet 0   No current facility-administered medications on file prior to visit.    No Known Allergies  Objective: There were no vitals filed for this visit.  General: No acute distress, AAOx3  Right foot: I&D/Medial ankle ulceration with a mix of fibrogranular tissue and central blood clot that measures 1.0 x 3.0 cm, same as last visit with granular margins, decreased cellulitis, decreased warmth, bloody drainage, no malodor, no acute signs of infection, capillary fill time <3 seconds in all digits, gross sensation present via light touch to right foot. Protective sensation absent. Mild pain to leg, no pain to calf.  No pain with calf compression. Negative Homans sign, remains the same.   Assessment and Plan:  Problem List Items Addressed This Visit    None    Visit Diagnoses    Post-operative state    -  Primary   Relevant Medications   oxyCODONE (OXY IR/ROXICODONE) 5 MG immediate release tablet   Ankle ulcer, right, with fat layer exposed (HCC)       Venous ulcer of ankle, right (HCC)  PVD (peripheral vascular disease) (HCC)       Diabetic polyneuropathy associated with type 2 diabetes mellitus (HCC)       Right ankle pain, unspecified chronicity          -Patient seen and evaluated -Cleansed ulcerations, applied PRISMA and dry sterile dressing to surgical site right foot secured with ACE wrap and stockinet as well as foot miracle cream to dry skin areas like previous.  -Advised patient to make sure to keep dressings clean, dry, and intact to right surgical site; dressings to be changed by home health using alginate and dry dressings like previous on Mondays or Tuesdays/ once a week; MAY HAVE THE HOME NURSE TO HELP WITH THE DRESSING CHANGE THE WEEK OF 9/3 SINCE OFFICE WILL  BE CLOSED  -Continue with surgical shoe -Continue with Oxycodone for pain; refill provided this visit -Return for continued wound care at next office visit or sooner if problems or issues arise. [name], DPM

## 2020-04-27 ENCOUNTER — Other Ambulatory Visit: Payer: Self-pay

## 2020-04-27 ENCOUNTER — Ambulatory Visit (INDEPENDENT_AMBULATORY_CARE_PROVIDER_SITE_OTHER): Payer: PPO | Admitting: Sports Medicine

## 2020-04-27 ENCOUNTER — Encounter: Payer: Self-pay | Admitting: Sports Medicine

## 2020-04-27 DIAGNOSIS — L97319 Non-pressure chronic ulcer of right ankle with unspecified severity: Secondary | ICD-10-CM

## 2020-04-27 DIAGNOSIS — L97312 Non-pressure chronic ulcer of right ankle with fat layer exposed: Secondary | ICD-10-CM

## 2020-04-27 DIAGNOSIS — I83013 Varicose veins of right lower extremity with ulcer of ankle: Secondary | ICD-10-CM

## 2020-04-27 DIAGNOSIS — I739 Peripheral vascular disease, unspecified: Secondary | ICD-10-CM

## 2020-04-27 DIAGNOSIS — E1142 Type 2 diabetes mellitus with diabetic polyneuropathy: Secondary | ICD-10-CM

## 2020-04-27 DIAGNOSIS — Z9889 Other specified postprocedural states: Secondary | ICD-10-CM

## 2020-04-27 MED ORDER — OXYCODONE HCL 5 MG PO TABS: 5.0000 mg | ORAL_TABLET | Freq: Four times a day (QID) | ORAL | 0 refills | Status: AC | PRN

## 2020-04-27 NOTE — Progress Notes (Signed)
Subjective: Scott Anderson is a 54 y.o. male patient seen today in office for POV #17 (DOS 12-16-19), S/P R ankle I&D and wound debridement.  Reports that pain is the same, denies headache, chest pain, shortness of breath, nausea, vomiting, fever, or chills.  No other issues noted.   Fasting blood sugar not recorded.   Patient Active Problem List   Diagnosis Date Noted  . Pituitary insufficiency (HCC) 12/11/2015  . Hypogonadism male 11/29/2015  . Erectile dysfunction 11/26/2015  . Morbid obesity (HCC) 11/26/2015  . Diabetes (HCC) 11/26/2015  . HTN (hypertension) 11/26/2015  . Hypothyroidism 11/26/2015  . Anxiety state 11/26/2015  . Hypertriglyceridemia 11/26/2015    Current Outpatient Medications on File Prior to Visit  Medication Sig Dispense Refill  . clomiPHENE (CLOMID) 50 MG tablet Take 0.5 tablets (25 mg total) by mouth daily. 10 tablet 0  . diazepam (VALIUM) 5 MG tablet TK 1 T PO  TID PRN  0  . fenofibrate 160 MG tablet TK 1 T PO D  3  . gabapentin (NEURONTIN) 300 MG capsule 3 (three) times daily.   1  . gemfibrozil (LOPID) 600 MG tablet Take by mouth.    . Insulin Glargine (TOUJEO SOLOSTAR) 300 UNIT/ML SOPN Inject 500 Units into the skin every morning. And pen needles 2/day 21 pen 11  . Insulin Syringe-Needle U-100 (B-D INS SYRINGE 2CC/29GX1/2") 29G X 1/2" 2 ML MISC Used for daily insulin injections 2x. 200 each 5  . levothyroxine (SYNTHROID, LEVOTHROID) 75 MCG tablet Take 1 tablet (75 mcg total) by mouth daily. 90 tablet 3  . lisinopril (PRINIVIL,ZESTRIL) 40 MG tablet TK 1 T PO D  5  . metFORMIN (GLUCOPHAGE) 1000 MG tablet Take 1,000 mg by mouth 2 (two) times daily with a meal.    . metoprolol tartrate (LOPRESSOR) 25 MG tablet   4  . Omega-3 1000 MG CAPS Take by mouth.    . potassium chloride (K-DUR,KLOR-CON) 10 MEQ tablet Take 10 mEq by mouth 2 (two) times daily. Reported on 11/26/2015    . PROAIR HFA 108 (90 Base) MCG/ACT inhaler INL 2 PFS PO Q 6 H PRN  5  . sildenafil  (REVATIO) 20 MG tablet 2-5 tabs, as needed for ED symptoms 50 tablet 11  . sulfamethoxazole-trimethoprim (BACTRIM) 400-80 MG tablet Take 1 tablet by mouth 2 (two) times daily. 28 tablet 0   No current facility-administered medications on file prior to visit.    No Known Allergies  Objective: There were no vitals filed for this visit.  General: No acute distress, AAOx3  Right foot: I&D/Medial ankle ulceration with a mix of fibrogranular tissue and central blood clot that measures 0.5 x 2.5cm with granular margins, decreased cellulitis, decreased warmth, bloody drainage, no malodor, no acute signs of infection, capillary fill time <3 seconds in all digits, gross sensation present via light touch to right foot. Protective sensation absent. Mild pain to leg, no pain to calf.  No pain with calf compression. Negative Homans sign, remains the same.   Assessment and Plan:  Problem List Items Addressed This Visit    None    Visit Diagnoses    Venous ulcer of ankle, right (HCC)    -  Primary   Post-operative state       Relevant Medications   oxyCODONE (OXY IR/ROXICODONE) 5 MG immediate release tablet   Ankle ulcer, right, with fat layer exposed (HCC)       PVD (peripheral vascular disease) (HCC)  Diabetic polyneuropathy associated with type 2 diabetes mellitus (HCC)          -Patient seen and evaluated -Cleansed ulcerations, applied PRISMA and dry sterile dressing to surgical site right foot secured with ACE wrap and stockinet as well as foot miracle cream to dry skin areas like previous.  -Advised patient to make sure to keep dressings clean, dry, and intact to right surgical site; dressings to be changed by home health using alginate and dry dressings, next week nurse to change twice weekly  -Continue with surgical shoe -Continue with Oxycodone for pain; refill provided again this visit -Return for continued wound care at next office visit or sooner if problems or issues  arise.  [name], DPM

## 2020-04-28 DIAGNOSIS — J449 Chronic obstructive pulmonary disease, unspecified: Secondary | ICD-10-CM | POA: Diagnosis not present

## 2020-04-30 ENCOUNTER — Telehealth: Payer: Self-pay

## 2020-04-30 NOTE — Telephone Encounter (Signed)
-----   Message from [name], [place] sent at 04/27/2020 10:12 PM EDT ----- Regarding: Home nursing this week [facility] please contact home nurse and have them to see the patient twice this week since we will be out of office on Friday. Apply Prisma/alginate covered with dry dressing.

## 2020-04-30 NOTE — Telephone Encounter (Signed)
Contacted [facility] and gave new wound care orders from Dr. Marylene Land. Lurline Del was advised to see pt twice this week and to apply prisma/alginate with dry dressing

## 2020-05-11 ENCOUNTER — Other Ambulatory Visit: Payer: Self-pay

## 2020-05-11 ENCOUNTER — Ambulatory Visit (INDEPENDENT_AMBULATORY_CARE_PROVIDER_SITE_OTHER): Payer: PPO | Admitting: Sports Medicine

## 2020-05-11 ENCOUNTER — Encounter: Payer: Self-pay | Admitting: Sports Medicine

## 2020-05-11 DIAGNOSIS — I739 Peripheral vascular disease, unspecified: Secondary | ICD-10-CM

## 2020-05-11 DIAGNOSIS — L97319 Non-pressure chronic ulcer of right ankle with unspecified severity: Secondary | ICD-10-CM

## 2020-05-11 DIAGNOSIS — I83013 Varicose veins of right lower extremity with ulcer of ankle: Secondary | ICD-10-CM | POA: Diagnosis not present

## 2020-05-11 DIAGNOSIS — E1142 Type 2 diabetes mellitus with diabetic polyneuropathy: Secondary | ICD-10-CM | POA: Diagnosis not present

## 2020-05-11 DIAGNOSIS — Z9889 Other specified postprocedural states: Secondary | ICD-10-CM

## 2020-05-11 MED ORDER — OXYCODONE HCL 5 MG PO TABS: 5.0000 mg | ORAL_TABLET | Freq: Four times a day (QID) | ORAL | 0 refills | Status: DC | PRN

## 2020-05-11 NOTE — Progress Notes (Signed)
Subjective: TROOPER OLANDER is a 54 y.o. male patient seen today in office for POV #18 (DOS 12-16-19), S/P R ankle I&D and wound debridement.  Reports that pain is the same, denies headache, chest pain, shortness of breath, nausea, vomiting, fever, or chills.  No other issues noted.   Fasting blood sugar not recorded.   Patient Active Problem List   Diagnosis Date Noted  . Pituitary insufficiency (HCC) 12/11/2015  . Hypogonadism male 11/29/2015  . Erectile dysfunction 11/26/2015  . Morbid obesity (HCC) 11/26/2015  . Diabetes (HCC) 11/26/2015  . HTN (hypertension) 11/26/2015  . Hypothyroidism 11/26/2015  . Anxiety state 11/26/2015  . Hypertriglyceridemia 11/26/2015    Current Outpatient Medications on File Prior to Visit  Medication Sig Dispense Refill  . clomiPHENE (CLOMID) 50 MG tablet Take 0.5 tablets (25 mg total) by mouth daily. 10 tablet 0  . diazepam (VALIUM) 5 MG tablet TK 1 T PO  TID PRN  0  . fenofibrate 160 MG tablet TK 1 T PO D  3  . gabapentin (NEURONTIN) 300 MG capsule 3 (three) times daily.   1  . gemfibrozil (LOPID) 600 MG tablet Take by mouth.    . Insulin Glargine (TOUJEO SOLOSTAR) 300 UNIT/ML SOPN Inject 500 Units into the skin every morning. And pen needles 2/day 21 pen 11  . Insulin Syringe-Needle U-100 (B-D INS SYRINGE 2CC/29GX1/2") 29G X 1/2" 2 ML MISC Used for daily insulin injections 2x. 200 each 5  . levothyroxine (SYNTHROID, LEVOTHROID) 75 MCG tablet Take 1 tablet (75 mcg total) by mouth daily. 90 tablet 3  . lisinopril (PRINIVIL,ZESTRIL) 40 MG tablet TK 1 T PO D  5  . metFORMIN (GLUCOPHAGE) 1000 MG tablet Take 1,000 mg by mouth 2 (two) times daily with a meal.    . metoprolol tartrate (LOPRESSOR) 25 MG tablet   4  . Omega-3 1000 MG CAPS Take by mouth.    . potassium chloride (K-DUR,KLOR-CON) 10 MEQ tablet Take 10 mEq by mouth 2 (two) times daily. Reported on 11/26/2015    . PROAIR HFA 108 (90 Base) MCG/ACT inhaler INL 2 PFS PO Q 6 H PRN  5  . sildenafil  (REVATIO) 20 MG tablet 2-5 tabs, as needed for ED symptoms 50 tablet 11  . sulfamethoxazole-trimethoprim (BACTRIM) 400-80 MG tablet Take 1 tablet by mouth 2 (two) times daily. 28 tablet 0   No current facility-administered medications on file prior to visit.    No Known Allergies  Objective: There were no vitals filed for this visit.  General: No acute distress, AAOx3  Right foot: I&D/Medial ankle ulceration with a mix of fibrogranular tissue and central blood clot that measures 0.3 x 0.5cm with granular margins, decreased cellulitis, decreased warmth, bloody drainage, no malodor, no acute signs of infection, capillary fill time <3 seconds in all digits, gross sensation present via light touch to right foot. Protective sensation absent. Mild pain to leg, no pain to calf.  No pain with calf compression. Negative Homans sign, remains the same.   Assessment and Plan:  Problem List Items Addressed This Visit    None    Visit Diagnoses    Post-operative state    -  Primary   Venous ulcer of ankle, right (HCC)       PVD (peripheral vascular disease) (HCC)       Diabetic polyneuropathy associated with type 2 diabetes mellitus (HCC)          -Patient seen and evaluated -Cleansed ulcerations, loose skin removed  with tissue nipper, applied PRISMA and Mepilex border to right ankle -Advised patient to make sure to keep dressings clean, dry, and intact to right surgical site; dressings to be changed by home health using above weekly -Continue with surgical shoe or a shoe that does not rub ankle -Continue with Oxycodone for pain; refill provided again this visit -Return for continued wound care at next office visit or sooner if problems or issues arise.  [name], DPM

## 2020-05-18 ENCOUNTER — Encounter: Payer: Self-pay | Admitting: Sports Medicine

## 2020-05-18 ENCOUNTER — Ambulatory Visit (INDEPENDENT_AMBULATORY_CARE_PROVIDER_SITE_OTHER): Payer: PPO | Admitting: Sports Medicine

## 2020-05-18 ENCOUNTER — Other Ambulatory Visit: Payer: Self-pay

## 2020-05-18 DIAGNOSIS — L97319 Non-pressure chronic ulcer of right ankle with unspecified severity: Secondary | ICD-10-CM | POA: Diagnosis not present

## 2020-05-18 DIAGNOSIS — E1142 Type 2 diabetes mellitus with diabetic polyneuropathy: Secondary | ICD-10-CM | POA: Diagnosis not present

## 2020-05-18 DIAGNOSIS — I83013 Varicose veins of right lower extremity with ulcer of ankle: Secondary | ICD-10-CM

## 2020-05-18 DIAGNOSIS — Z9889 Other specified postprocedural states: Secondary | ICD-10-CM | POA: Diagnosis not present

## 2020-05-18 DIAGNOSIS — I739 Peripheral vascular disease, unspecified: Secondary | ICD-10-CM | POA: Diagnosis not present

## 2020-05-18 MED ORDER — OXYCODONE HCL 5 MG PO TABS
5.0000 mg | ORAL_TABLET | Freq: Four times a day (QID) | ORAL | 0 refills | Status: DC | PRN
Start: 2020-05-18 — End: 2020-05-25

## 2020-05-18 NOTE — Progress Notes (Signed)
Subjective: Scott Anderson is a 54 y.o. male patient seen today in office for POV #19 (DOS 12-16-19), S/P R ankle I&D and wound debridement.  Reports that pain is the same but wife is happy with how its healing, denies headache, chest pain, shortness of breath, nausea, vomiting, fever, or chills.  No other issues noted.   Fasting blood sugar not recorded.   Patient Active Problem List   Diagnosis Date Noted  . Pituitary insufficiency (HCC) 12/11/2015  . Hypogonadism male 11/29/2015  . Erectile dysfunction 11/26/2015  . Morbid obesity (HCC) 11/26/2015  . Diabetes (HCC) 11/26/2015  . HTN (hypertension) 11/26/2015  . Hypothyroidism 11/26/2015  . Anxiety state 11/26/2015  . Hypertriglyceridemia 11/26/2015    Current Outpatient Medications on File Prior to Visit  Medication Sig Dispense Refill  . clomiPHENE (CLOMID) 50 MG tablet Take 0.5 tablets (25 mg total) by mouth daily. 10 tablet 0  . diazepam (VALIUM) 5 MG tablet TK 1 T PO  TID PRN  0  . fenofibrate 160 MG tablet TK 1 T PO D  3  . gabapentin (NEURONTIN) 300 MG capsule 3 (three) times daily.   1  . gemfibrozil (LOPID) 600 MG tablet Take by mouth.    . Insulin Glargine (TOUJEO SOLOSTAR) 300 UNIT/ML SOPN Inject 500 Units into the skin every morning. And pen needles 2/day 21 pen 11  . Insulin Syringe-Needle U-100 (B-D INS SYRINGE 2CC/29GX1/2") 29G X 1/2" 2 ML MISC Used for daily insulin injections 2x. 200 each 5  . levothyroxine (SYNTHROID, LEVOTHROID) 75 MCG tablet Take 1 tablet (75 mcg total) by mouth daily. 90 tablet 3  . lisinopril (PRINIVIL,ZESTRIL) 40 MG tablet TK 1 T PO D  5  . metFORMIN (GLUCOPHAGE) 1000 MG tablet Take 1,000 mg by mouth 2 (two) times daily with a meal.    . metoprolol tartrate (LOPRESSOR) 25 MG tablet   4  . Omega-3 1000 MG CAPS Take by mouth.    . potassium chloride (K-DUR,KLOR-CON) 10 MEQ tablet Take 10 mEq by mouth 2 (two) times daily. Reported on 11/26/2015    . PROAIR HFA 108 (90 Base) MCG/ACT inhaler INL 2  PFS PO Q 6 H PRN  5  . sildenafil (REVATIO) 20 MG tablet 2-5 tabs, as needed for ED symptoms 50 tablet 11  . sulfamethoxazole-trimethoprim (BACTRIM) 400-80 MG tablet Take 1 tablet by mouth 2 (two) times daily. 28 tablet 0   No current facility-administered medications on file prior to visit.    No Known Allergies  Objective: There were no vitals filed for this visit.  General: No acute distress, AAOx3  Right foot: I&D/Medial ankle ulceration with a mix of fibrogranular tissue and central blood clot that measures 0.3 x 0.4cm with granular margins, decreased cellulitis, decreased warmth, bloody drainage, no malodor, no acute signs of infection, capillary fill time <3 seconds in all digits, gross sensation present via light touch to right foot. Protective sensation absent. Mild pain to leg, no pain to calf.  No pain with calf compression. Negative Homans sign, remains the same.   Assessment and Plan:  Problem List Items Addressed This Visit    None    Visit Diagnoses    Post-operative state    -  Primary   Venous ulcer of ankle, right (HCC)       PVD (peripheral vascular disease) (HCC)       Diabetic polyneuropathy associated with type 2 diabetes mellitus (HCC)          -Patient  seen and evaluated -Cleansed ulcerations, loose skin removed with tissue nipper, applied PRISMA and Mepilex border to right ankle, with foot miracle cream to the leg -Advised patient to make sure to keep dressings clean, dry, and intact to right surgical site; dressings to be changed by home health using above weekly -Continue with surgical shoe or a shoe that does not rub ankle -Continue with Oxycodone for pain; refill provided again this visit -Return for continued wound care at next office visit or sooner if problems or issues arise.  Scott Anderson, DPM

## 2020-05-20 DIAGNOSIS — E7800 Pure hypercholesterolemia, unspecified: Secondary | ICD-10-CM | POA: Diagnosis not present

## 2020-05-20 DIAGNOSIS — E039 Hypothyroidism, unspecified: Secondary | ICD-10-CM | POA: Diagnosis not present

## 2020-05-20 DIAGNOSIS — E1142 Type 2 diabetes mellitus with diabetic polyneuropathy: Secondary | ICD-10-CM | POA: Diagnosis not present

## 2020-05-20 DIAGNOSIS — F329 Major depressive disorder, single episode, unspecified: Secondary | ICD-10-CM | POA: Diagnosis not present

## 2020-05-20 DIAGNOSIS — Z7982 Long term (current) use of aspirin: Secondary | ICD-10-CM | POA: Diagnosis not present

## 2020-05-20 DIAGNOSIS — M1991 Primary osteoarthritis, unspecified site: Secondary | ICD-10-CM | POA: Diagnosis not present

## 2020-05-20 DIAGNOSIS — E871 Hypo-osmolality and hyponatremia: Secondary | ICD-10-CM | POA: Diagnosis not present

## 2020-05-20 DIAGNOSIS — N179 Acute kidney failure, unspecified: Secondary | ICD-10-CM | POA: Diagnosis not present

## 2020-05-20 DIAGNOSIS — L03115 Cellulitis of right lower limb: Secondary | ICD-10-CM | POA: Diagnosis not present

## 2020-05-20 DIAGNOSIS — Z792 Long term (current) use of antibiotics: Secondary | ICD-10-CM | POA: Diagnosis not present

## 2020-05-20 DIAGNOSIS — L02611 Cutaneous abscess of right foot: Secondary | ICD-10-CM | POA: Diagnosis not present

## 2020-05-20 DIAGNOSIS — Z8631 Personal history of diabetic foot ulcer: Secondary | ICD-10-CM | POA: Diagnosis not present

## 2020-05-20 DIAGNOSIS — Z79891 Long term (current) use of opiate analgesic: Secondary | ICD-10-CM | POA: Diagnosis not present

## 2020-05-20 DIAGNOSIS — F419 Anxiety disorder, unspecified: Secondary | ICD-10-CM | POA: Diagnosis not present

## 2020-05-20 DIAGNOSIS — Z791 Long term (current) use of non-steroidal anti-inflammatories (NSAID): Secondary | ICD-10-CM | POA: Diagnosis not present

## 2020-05-20 DIAGNOSIS — Z79890 Hormone replacement therapy (postmenopausal): Secondary | ICD-10-CM | POA: Diagnosis not present

## 2020-05-20 DIAGNOSIS — B951 Streptococcus, group B, as the cause of diseases classified elsewhere: Secondary | ICD-10-CM | POA: Diagnosis not present

## 2020-05-20 DIAGNOSIS — D72829 Elevated white blood cell count, unspecified: Secondary | ICD-10-CM | POA: Diagnosis not present

## 2020-05-20 DIAGNOSIS — Z794 Long term (current) use of insulin: Secondary | ICD-10-CM | POA: Diagnosis not present

## 2020-05-20 DIAGNOSIS — E1165 Type 2 diabetes mellitus with hyperglycemia: Secondary | ICD-10-CM | POA: Diagnosis not present

## 2020-05-20 DIAGNOSIS — I509 Heart failure, unspecified: Secondary | ICD-10-CM | POA: Diagnosis not present

## 2020-05-20 DIAGNOSIS — E669 Obesity, unspecified: Secondary | ICD-10-CM | POA: Diagnosis not present

## 2020-05-20 DIAGNOSIS — I110 Hypertensive heart disease with heart failure: Secondary | ICD-10-CM | POA: Diagnosis not present

## 2020-05-25 ENCOUNTER — Encounter: Payer: Self-pay | Admitting: Sports Medicine

## 2020-05-25 ENCOUNTER — Other Ambulatory Visit: Payer: Self-pay

## 2020-05-25 ENCOUNTER — Ambulatory Visit (INDEPENDENT_AMBULATORY_CARE_PROVIDER_SITE_OTHER): Payer: PPO | Admitting: Sports Medicine

## 2020-05-25 DIAGNOSIS — I739 Peripheral vascular disease, unspecified: Secondary | ICD-10-CM | POA: Diagnosis not present

## 2020-05-25 DIAGNOSIS — I83013 Varicose veins of right lower extremity with ulcer of ankle: Secondary | ICD-10-CM

## 2020-05-25 DIAGNOSIS — L97319 Non-pressure chronic ulcer of right ankle with unspecified severity: Secondary | ICD-10-CM

## 2020-05-25 DIAGNOSIS — E1142 Type 2 diabetes mellitus with diabetic polyneuropathy: Secondary | ICD-10-CM | POA: Diagnosis not present

## 2020-05-25 DIAGNOSIS — Z9889 Other specified postprocedural states: Secondary | ICD-10-CM

## 2020-05-25 MED ORDER — OXYCODONE HCL 5 MG PO TABS
5.0000 mg | ORAL_TABLET | Freq: Four times a day (QID) | ORAL | 0 refills | Status: DC | PRN
Start: 2020-05-25 — End: 2020-06-01

## 2020-05-25 NOTE — Progress Notes (Signed)
Subjective: Scott Anderson is a 54 y.o. male patient seen today in office for POV #20 (DOS 12-16-19), S/P R ankle I&D and wound debridement.  Reports that pain is the same but wound seems to be healing, denies headache, chest pain, shortness of breath, nausea, vomiting, fever, or chills.  No other issues noted.   Fasting blood sugar not recorded.   Patient Active Problem List   Diagnosis Date Noted  . Pituitary insufficiency (HCC) 12/11/2015  . Hypogonadism male 11/29/2015  . Erectile dysfunction 11/26/2015  . Morbid obesity (HCC) 11/26/2015  . Diabetes (HCC) 11/26/2015  . HTN (hypertension) 11/26/2015  . Hypothyroidism 11/26/2015  . Anxiety state 11/26/2015  . Hypertriglyceridemia 11/26/2015    Current Outpatient Medications on File Prior to Visit  Medication Sig Dispense Refill  . clomiPHENE (CLOMID) 50 MG tablet Take 0.5 tablets (25 mg total) by mouth daily. 10 tablet 0  . diazepam (VALIUM) 5 MG tablet TK 1 T PO  TID PRN  0  . fenofibrate 160 MG tablet TK 1 T PO D  3  . gabapentin (NEURONTIN) 300 MG capsule 3 (three) times daily.   1  . gemfibrozil (LOPID) 600 MG tablet Take by mouth.    . Insulin Glargine (TOUJEO SOLOSTAR) 300 UNIT/ML SOPN Inject 500 Units into the skin every morning. And pen needles 2/day 21 pen 11  . Insulin Syringe-Needle U-100 (B-D INS SYRINGE 2CC/29GX1/2") 29G X 1/2" 2 ML MISC Used for daily insulin injections 2x. 200 each 5  . levothyroxine (SYNTHROID, LEVOTHROID) 75 MCG tablet Take 1 tablet (75 mcg total) by mouth daily. 90 tablet 3  . lisinopril (PRINIVIL,ZESTRIL) 40 MG tablet TK 1 T PO D  5  . metFORMIN (GLUCOPHAGE) 1000 MG tablet Take 1,000 mg by mouth 2 (two) times daily with a meal.    . metoprolol tartrate (LOPRESSOR) 25 MG tablet   4  . Omega-3 1000 MG CAPS Take by mouth.    . potassium chloride (K-DUR,KLOR-CON) 10 MEQ tablet Take 10 mEq by mouth 2 (two) times daily. Reported on 11/26/2015    . PROAIR HFA 108 (90 Base) MCG/ACT inhaler INL 2 PFS PO Q 6  H PRN  5  . sildenafil (REVATIO) 20 MG tablet 2-5 tabs, as needed for ED symptoms 50 tablet 11  . sulfamethoxazole-trimethoprim (BACTRIM) 400-80 MG tablet Take 1 tablet by mouth 2 (two) times daily. 28 tablet 0   No current facility-administered medications on file prior to visit.    No Known Allergies  Objective: There were no vitals filed for this visit.  General: No acute distress, AAOx3  Right foot: I&D/Medial ankle ulceration with a mix of fibrogranular tissue and central blood clot that measures 0.3 x 0.3cm with granular margins, decreased cellulitis, decreased warmth, bloody drainage, no malodor, no acute signs of infection, capillary fill time <3 seconds in all digits, gross sensation present via light touch to right foot. Protective sensation absent. Mild pain to leg, no pain to calf.  No pain with calf compression. Negative Homans sign, remains the same.   Assessment and Plan:  Problem List Items Addressed This Visit    None    Visit Diagnoses    Venous ulcer of ankle, right (HCC)    -  Primary   Post-operative state       PVD (peripheral vascular disease) (HCC)       Diabetic polyneuropathy associated with type 2 diabetes mellitus (HCC)          -Patient seen and  evaluated -Cleansed ulcerations, loose skin removed with tissue nipper, applied PRISMA and Mepilex border to right ankle, with foot miracle cream to the leg -Advised patient to make sure to keep dressings clean, dry, and intact to right surgical site; dressings to be changed by home health using above weekly -Continue with surgical shoe or a shoe that does not rub ankle -Continue with Oxycodone for pain; refill provided again this visit -Return for continued wound care at next office visit or sooner if problems or issues arise.  [name], DPM

## 2020-05-29 DIAGNOSIS — J449 Chronic obstructive pulmonary disease, unspecified: Secondary | ICD-10-CM | POA: Diagnosis not present

## 2020-06-01 ENCOUNTER — Other Ambulatory Visit: Payer: Self-pay

## 2020-06-01 ENCOUNTER — Ambulatory Visit (INDEPENDENT_AMBULATORY_CARE_PROVIDER_SITE_OTHER): Payer: PPO | Admitting: Sports Medicine

## 2020-06-01 ENCOUNTER — Encounter: Payer: Self-pay | Admitting: Sports Medicine

## 2020-06-01 DIAGNOSIS — L97319 Non-pressure chronic ulcer of right ankle with unspecified severity: Secondary | ICD-10-CM

## 2020-06-01 DIAGNOSIS — I739 Peripheral vascular disease, unspecified: Secondary | ICD-10-CM | POA: Diagnosis not present

## 2020-06-01 DIAGNOSIS — Z9889 Other specified postprocedural states: Secondary | ICD-10-CM | POA: Diagnosis not present

## 2020-06-01 DIAGNOSIS — I83013 Varicose veins of right lower extremity with ulcer of ankle: Secondary | ICD-10-CM

## 2020-06-01 MED ORDER — OXYCODONE HCL 5 MG PO TABS
5.0000 mg | ORAL_TABLET | Freq: Four times a day (QID) | ORAL | 0 refills | Status: AC | PRN
Start: 2020-06-01 — End: 2020-06-08

## 2020-06-01 NOTE — Progress Notes (Signed)
Subjective: Scott Anderson is a 54 y.o. male patient seen today in office for POV #20 (DOS 12-16-19), S/P R ankle I&D and wound debridement.  Reports that doing okay foot is feeling and looking better but is wondering if he can take a shower.  Patient denies headache, chest pain, shortness of breath, nausea, vomiting, fever, or chills.  No other issues noted.   Fasting blood sugar not recorded.   Patient Active Problem List   Diagnosis Date Noted  . Pituitary insufficiency (HCC) 12/11/2015  . Hypogonadism male 11/29/2015  . Erectile dysfunction 11/26/2015  . Morbid obesity (HCC) 11/26/2015  . Diabetes (HCC) 11/26/2015  . HTN (hypertension) 11/26/2015  . Hypothyroidism 11/26/2015  . Anxiety state 11/26/2015  . Hypertriglyceridemia 11/26/2015    Current Outpatient Medications on File Prior to Visit  Medication Sig Dispense Refill  . clomiPHENE (CLOMID) 50 MG tablet Take 0.5 tablets (25 mg total) by mouth daily. 10 tablet 0  . diazepam (VALIUM) 5 MG tablet TK 1 T PO  TID PRN  0  . fenofibrate 160 MG tablet TK 1 T PO D  3  . gabapentin (NEURONTIN) 300 MG capsule 3 (three) times daily.   1  . gemfibrozil (LOPID) 600 MG tablet Take by mouth.    . Insulin Glargine (TOUJEO SOLOSTAR) 300 UNIT/ML SOPN Inject 500 Units into the skin every morning. And pen needles 2/day 21 pen 11  . Insulin Syringe-Needle U-100 (B-D INS SYRINGE 2CC/29GX1/2") 29G X 1/2" 2 ML MISC Used for daily insulin injections 2x. 200 each 5  . levothyroxine (SYNTHROID, LEVOTHROID) 75 MCG tablet Take 1 tablet (75 mcg total) by mouth daily. 90 tablet 3  . lisinopril (PRINIVIL,ZESTRIL) 40 MG tablet TK 1 T PO D  5  . metFORMIN (GLUCOPHAGE) 1000 MG tablet Take 1,000 mg by mouth 2 (two) times daily with a meal.    . metoprolol tartrate (LOPRESSOR) 25 MG tablet   4  . Omega-3 1000 MG CAPS Take by mouth.    . potassium chloride (K-DUR,KLOR-CON) 10 MEQ tablet Take 10 mEq by mouth 2 (two) times daily. Reported on 11/26/2015    . PROAIR  HFA 108 (90 Base) MCG/ACT inhaler INL 2 PFS PO Q 6 H PRN  5  . sildenafil (REVATIO) 20 MG tablet 2-5 tabs, as needed for ED symptoms 50 tablet 11  . sulfamethoxazole-trimethoprim (BACTRIM) 400-80 MG tablet Take 1 tablet by mouth 2 (two) times daily. 28 tablet 0   No current facility-administered medications on file prior to visit.    No Known Allergies  Objective: There were no vitals filed for this visit.  General: No acute distress, AAOx3  Right foot: I&D/Medial ankle ulceration appears to be prematurely healed no warmth, no bloody drainage, no malodor, no acute signs of infection, capillary fill time <3 seconds in all digits, gross sensation present via light touch to right foot. Protective sensation absent.  Decreased pain to leg, no pain to calf.  No pain with calf compression. Negative Homans sign, remains the same.   Assessment and Plan:  Problem List Items Addressed This Visit    None    Visit Diagnoses    Venous ulcer of ankle, right (HCC)    -  Primary   Post-operative state       PVD (peripheral vascular disease) (HCC)          -Patient seen and evaluated -Previous ulceration at right medial ankle is now healed -May discontinue bandages -May shower -Advised patient to wear  a shoe that does not rub at his ankle -Patient will return to office next week for me to evaluate to see if he can return to wearing a shoe with a soft however at this time patient to continue to wear his bedroom slippers that do not rub the area -Continue with Oxycodone for pain; refill provided again this visit -Return for continued wound care at next office visit or sooner if problems or issues arise.  [name], DPM

## 2020-06-08 ENCOUNTER — Other Ambulatory Visit: Payer: Self-pay

## 2020-06-08 ENCOUNTER — Encounter: Payer: Self-pay | Admitting: Sports Medicine

## 2020-06-08 ENCOUNTER — Ambulatory Visit (INDEPENDENT_AMBULATORY_CARE_PROVIDER_SITE_OTHER): Payer: PPO | Admitting: Sports Medicine

## 2020-06-08 DIAGNOSIS — I83013 Varicose veins of right lower extremity with ulcer of ankle: Secondary | ICD-10-CM | POA: Diagnosis not present

## 2020-06-08 DIAGNOSIS — L97319 Non-pressure chronic ulcer of right ankle with unspecified severity: Secondary | ICD-10-CM

## 2020-06-08 DIAGNOSIS — E1142 Type 2 diabetes mellitus with diabetic polyneuropathy: Secondary | ICD-10-CM

## 2020-06-08 DIAGNOSIS — Z9889 Other specified postprocedural states: Secondary | ICD-10-CM | POA: Diagnosis not present

## 2020-06-08 DIAGNOSIS — I739 Peripheral vascular disease, unspecified: Secondary | ICD-10-CM

## 2020-06-08 NOTE — Progress Notes (Signed)
Subjective: Scott Anderson is a 54 y.o. male patient seen today in office for POV #21 (DOS 12-16-19), S/P R ankle I&D and wound debridement.  Reports that doing okay.  Patient denies headache, chest pain, shortness of breath, nausea, vomiting, fever, or chills.  No other issues noted.   Fasting blood sugar not recorded.   Patient Active Problem List   Diagnosis Date Noted  . Pituitary insufficiency (HCC) 12/11/2015  . Hypogonadism male 11/29/2015  . Erectile dysfunction 11/26/2015  . Morbid obesity (HCC) 11/26/2015  . Diabetes (HCC) 11/26/2015  . HTN (hypertension) 11/26/2015  . Hypothyroidism 11/26/2015  . Anxiety state 11/26/2015  . Hypertriglyceridemia 11/26/2015    Current Outpatient Medications on File Prior to Visit  Medication Sig Dispense Refill  . clomiPHENE (CLOMID) 50 MG tablet Take 0.5 tablets (25 mg total) by mouth daily. 10 tablet 0  . diazepam (VALIUM) 5 MG tablet TK 1 T PO  TID PRN  0  . fenofibrate 160 MG tablet TK 1 T PO D  3  . gabapentin (NEURONTIN) 300 MG capsule 3 (three) times daily.   1  . gemfibrozil (LOPID) 600 MG tablet Take by mouth.    . Insulin Glargine (TOUJEO SOLOSTAR) 300 UNIT/ML SOPN Inject 500 Units into the skin every morning. And pen needles 2/day 21 pen 11  . Insulin Syringe-Needle U-100 (B-D INS SYRINGE 2CC/29GX1/2") 29G X 1/2" 2 ML MISC Used for daily insulin injections 2x. 200 each 5  . levothyroxine (SYNTHROID, LEVOTHROID) 75 MCG tablet Take 1 tablet (75 mcg total) by mouth daily. 90 tablet 3  . lisinopril (PRINIVIL,ZESTRIL) 40 MG tablet TK 1 T PO D  5  . metFORMIN (GLUCOPHAGE) 1000 MG tablet Take 1,000 mg by mouth 2 (two) times daily with a meal.    . metoprolol tartrate (LOPRESSOR) 25 MG tablet   4  . Omega-3 1000 MG CAPS Take by mouth.    . oxyCODONE (ROXICODONE) 5 MG immediate release tablet Take 1 tablet (5 mg total) by mouth every 6 (six) hours as needed for up to 7 days for severe pain. 28 tablet 0  . potassium chloride (K-DUR,KLOR-CON)  10 MEQ tablet Take 10 mEq by mouth 2 (two) times daily. Reported on 11/26/2015    . PROAIR HFA 108 (90 Base) MCG/ACT inhaler INL 2 PFS PO Q 6 H PRN  5  . sildenafil (REVATIO) 20 MG tablet 2-5 tabs, as needed for ED symptoms 50 tablet 11  . sulfamethoxazole-trimethoprim (BACTRIM) 400-80 MG tablet Take 1 tablet by mouth 2 (two) times daily. 28 tablet 0   No current facility-administered medications on file prior to visit.    No Known Allergies  Objective: There were no vitals filed for this visit.  General: No acute distress, AAOx3  Right foot: I&D/Medial ankle ulceration appears to be healed no warmth, no bloody drainage, no malodor, no acute signs of infection, capillary fill time <3 seconds in all digits, gross sensation present via light touch to right foot. Protective sensation absent.  Decreased pain to leg, no pain to calf.  No pain with calf compression. Negative Homans sign, remains the same.   Assessment and Plan:  Problem List Items Addressed This Visit    None    Visit Diagnoses    Venous ulcer of ankle, right (HCC)    -  Primary   healed   Post-operative state       PVD (peripheral vascular disease) (HCC)       Diabetic polyneuropathy associated with  type 2 diabetes mellitus (HCC)          -Patient seen and evaluated -Previous ulceration at right medial ankle remains healed -Advised patient to wear a shoe that does not rub at his ankle and then next visit we will discuss other shoes  -Return in 2 -3 weeks for final wound check.  [name], DPM

## 2020-06-15 ENCOUNTER — Ambulatory Visit: Payer: PPO | Admitting: Sports Medicine

## 2020-06-28 DIAGNOSIS — J449 Chronic obstructive pulmonary disease, unspecified: Secondary | ICD-10-CM | POA: Diagnosis not present

## 2020-06-29 ENCOUNTER — Other Ambulatory Visit: Payer: Self-pay

## 2020-06-29 ENCOUNTER — Encounter: Payer: Self-pay | Admitting: Sports Medicine

## 2020-06-29 ENCOUNTER — Ambulatory Visit (INDEPENDENT_AMBULATORY_CARE_PROVIDER_SITE_OTHER): Payer: PPO | Admitting: Sports Medicine

## 2020-06-29 DIAGNOSIS — Z9889 Other specified postprocedural states: Secondary | ICD-10-CM | POA: Diagnosis not present

## 2020-06-29 DIAGNOSIS — E1142 Type 2 diabetes mellitus with diabetic polyneuropathy: Secondary | ICD-10-CM

## 2020-06-29 DIAGNOSIS — L97319 Non-pressure chronic ulcer of right ankle with unspecified severity: Secondary | ICD-10-CM

## 2020-06-29 DIAGNOSIS — I83013 Varicose veins of right lower extremity with ulcer of ankle: Secondary | ICD-10-CM

## 2020-06-29 DIAGNOSIS — I739 Peripheral vascular disease, unspecified: Secondary | ICD-10-CM | POA: Diagnosis not present

## 2020-06-29 NOTE — Progress Notes (Signed)
Subjective: Scott Anderson is a 54 y.o. male patient seen today in office for POV #22 (DOS 12-16-19), S/P R ankle I&D and wound debridement.  Reports that doing okay.  Reports that he wants to see if he can try to wear normal shoe.  Patient denies headache, chest pain, shortness of breath, nausea, vomiting, fever, or chills.  No other issues noted.   Fasting blood sugar not recorded.   Patient Active Problem List   Diagnosis Date Noted  . Pituitary insufficiency (HCC) 12/11/2015  . Hypogonadism male 11/29/2015  . Erectile dysfunction 11/26/2015  . Morbid obesity (HCC) 11/26/2015  . Diabetes (HCC) 11/26/2015  . HTN (hypertension) 11/26/2015  . Hypothyroidism 11/26/2015  . Anxiety state 11/26/2015  . Hypertriglyceridemia 11/26/2015    Current Outpatient Medications on File Prior to Visit  Medication Sig Dispense Refill  . clomiPHENE (CLOMID) 50 MG tablet Take 0.5 tablets (25 mg total) by mouth daily. 10 tablet 0  . diazepam (VALIUM) 5 MG tablet TK 1 T PO  TID PRN  0  . fenofibrate 160 MG tablet TK 1 T PO D  3  . gabapentin (NEURONTIN) 300 MG capsule 3 (three) times daily.   1  . gemfibrozil (LOPID) 600 MG tablet Take by mouth.    . Insulin Glargine (TOUJEO SOLOSTAR) 300 UNIT/ML SOPN Inject 500 Units into the skin every morning. And pen needles 2/day 21 pen 11  . Insulin Syringe-Needle U-100 (B-D INS SYRINGE 2CC/29GX1/2") 29G X 1/2" 2 ML MISC Used for daily insulin injections 2x. 200 each 5  . levothyroxine (SYNTHROID, LEVOTHROID) 75 MCG tablet Take 1 tablet (75 mcg total) by mouth daily. 90 tablet 3  . lisinopril (PRINIVIL,ZESTRIL) 40 MG tablet TK 1 T PO D  5  . metFORMIN (GLUCOPHAGE) 1000 MG tablet Take 1,000 mg by mouth 2 (two) times daily with a meal.    . metoprolol tartrate (LOPRESSOR) 25 MG tablet   4  . Omega-3 1000 MG CAPS Take by mouth.    . potassium chloride (K-DUR,KLOR-CON) 10 MEQ tablet Take 10 mEq by mouth 2 (two) times daily. Reported on 11/26/2015    . PROAIR HFA 108 (90  Base) MCG/ACT inhaler INL 2 PFS PO Q 6 H PRN  5  . sildenafil (REVATIO) 20 MG tablet 2-5 tabs, as needed for ED symptoms 50 tablet 11  . sulfamethoxazole-trimethoprim (BACTRIM) 400-80 MG tablet Take 1 tablet by mouth 2 (two) times daily. 28 tablet 0   No current facility-administered medications on file prior to visit.    No Known Allergies  Objective: There were no vitals filed for this visit.  General: No acute distress, AAOx3  Right foot: I&D/Medial ankle ulceration remains healed with dry scab, no warmth, no bloody drainage, no malodor, no acute signs of infection, capillary fill time <3 seconds in all digits, gross sensation present via light touch to right foot. Protective sensation absent.  Minimal pain to leg, no pain to calf.  No pain with calf compression. Negative Homans sign, remains the same.  Nails bilateral x10 are thickened elongated consistent with onychomycosis  Assessment and Plan:  Problem List Items Addressed This Visit    None    Visit Diagnoses    Venous ulcer of ankle, right (HCC)    -  Primary   Post-operative state       PVD (peripheral vascular disease) (HCC)       Diabetic polyneuropathy associated with type 2 diabetes mellitus (HCC)          -  Patient seen and evaluated -Previous ulceration at right medial ankle remains healed -Advised patient he may start to wear tennis shoe as long as it does not rub his ankle -And no additional charge mechanically debrided nails x10 using a sterile nail nipper without incident -Return in 4 weeks for final wound check or sooner if problems or issues arise.  [name], DPM

## 2020-07-10 ENCOUNTER — Telehealth: Payer: Self-pay | Admitting: *Deleted

## 2020-07-10 NOTE — Telephone Encounter (Signed)
-----   Message from [name], [place] sent at 07/10/2020 11:16 AM EST ----- Yes he can but he should not bathe more than 10 mins -Dr. Kathie Rhodes ----- Message ----- From: Lanney Gins, [facility] Sent: 07/10/2020   9:06 AM EST To: [name], DPM  Hey-Patient wanted to know if he could take a tub bath instead of the shower due to he is still weak and I stated that I would ask you and call the patient back. Misty Stanley

## 2020-07-10 NOTE — Telephone Encounter (Signed)
Called and relayed the message to the patient per Dr Marylene Land. Misty Stanley

## 2020-07-20 ENCOUNTER — Other Ambulatory Visit: Payer: Self-pay | Admitting: Sports Medicine

## 2020-07-20 DIAGNOSIS — Z9889 Other specified postprocedural states: Secondary | ICD-10-CM

## 2020-07-20 MED ORDER — SULFAMETHOXAZOLE-TRIMETHOPRIM 400-80 MG PO TABS: 1.0000 | ORAL_TABLET | Freq: Two times a day (BID) | ORAL | 0 refills | Status: DC

## 2020-07-20 NOTE — Progress Notes (Signed)
Sent antibotic

## 2020-07-29 DIAGNOSIS — J449 Chronic obstructive pulmonary disease, unspecified: Secondary | ICD-10-CM | POA: Diagnosis not present

## 2020-07-31 ENCOUNTER — Other Ambulatory Visit: Payer: Self-pay

## 2020-07-31 ENCOUNTER — Ambulatory Visit (INDEPENDENT_AMBULATORY_CARE_PROVIDER_SITE_OTHER): Payer: PPO | Admitting: Sports Medicine

## 2020-07-31 ENCOUNTER — Encounter: Payer: Self-pay | Admitting: Sports Medicine

## 2020-07-31 VITALS — Temp 96.9°F | Resp 14

## 2020-07-31 DIAGNOSIS — L02619 Cutaneous abscess of unspecified foot: Secondary | ICD-10-CM

## 2020-07-31 DIAGNOSIS — L97929 Non-pressure chronic ulcer of unspecified part of left lower leg with unspecified severity: Secondary | ICD-10-CM | POA: Diagnosis not present

## 2020-07-31 DIAGNOSIS — Z9889 Other specified postprocedural states: Secondary | ICD-10-CM

## 2020-07-31 DIAGNOSIS — I83029 Varicose veins of left lower extremity with ulcer of unspecified site: Secondary | ICD-10-CM | POA: Diagnosis not present

## 2020-07-31 DIAGNOSIS — I83013 Varicose veins of right lower extremity with ulcer of ankle: Secondary | ICD-10-CM

## 2020-07-31 DIAGNOSIS — M79671 Pain in right foot: Secondary | ICD-10-CM

## 2020-07-31 DIAGNOSIS — I739 Peripheral vascular disease, unspecified: Secondary | ICD-10-CM

## 2020-07-31 DIAGNOSIS — E1142 Type 2 diabetes mellitus with diabetic polyneuropathy: Secondary | ICD-10-CM

## 2020-07-31 DIAGNOSIS — L03119 Cellulitis of unspecified part of limb: Secondary | ICD-10-CM

## 2020-07-31 DIAGNOSIS — M79672 Pain in left foot: Secondary | ICD-10-CM

## 2020-07-31 DIAGNOSIS — L97319 Non-pressure chronic ulcer of right ankle with unspecified severity: Secondary | ICD-10-CM | POA: Diagnosis not present

## 2020-07-31 MED ORDER — OXYCODONE-ACETAMINOPHEN 10-325 MG PO TABS: 1.0000 | ORAL_TABLET | Freq: Three times a day (TID) | ORAL | 0 refills | Status: AC | PRN

## 2020-07-31 MED ORDER — DOXYCYCLINE HYCLATE 100 MG PO TABS: 100.0000 mg | ORAL_TABLET | Freq: Two times a day (BID) | ORAL | 0 refills | Status: DC

## 2020-07-31 NOTE — Progress Notes (Signed)
Subjective: Scott Anderson is a 54 y.o. male patient seen today in office for POV # 23 (DOS 12-16-19), S/P R ankle I&D and wound debridement.  Reports that he has a new issue where he fell and scraped the left leg and also rescrape the right leg and ankle. Patient reports that he is dealing with swelling and has been doing a lot of work around the house and in the bathroom and has been on his feet and legs more dealing with that. Patient denies headache, chest pain, shortness of breath, nausea, vomiting, fever, or chills.  No other issues noted.   Fasting blood sugar not recorded.   Patient Active Problem List   Diagnosis Date Noted  . Obstructive sleep apnea on CPAP 05/26/2019  . Chronic coronary artery disease 05/25/2019  . Congestive cardiomyopathy (HCC) 05/25/2019  . Congestive heart failure (HCC) 05/25/2019  . Elevated brain natriuretic peptide (BNP) level 04/29/2019  . Pituitary insufficiency (HCC) 12/11/2015  . Hypogonadism male 11/29/2015  . Erectile dysfunction 11/26/2015  . Morbid obesity (HCC) 11/26/2015  . Diabetes (HCC) 11/26/2015  . HTN (hypertension) 11/26/2015  . Hypothyroidism 11/26/2015  . Anxiety state 11/26/2015  . Hypertriglyceridemia 11/26/2015    Current Outpatient Medications on File Prior to Visit  Medication Sig Dispense Refill  . clomiPHENE (CLOMID) 50 MG tablet Take 0.5 tablets (25 mg total) by mouth daily. 10 tablet 0  . diazepam (VALIUM) 5 MG tablet TK 1 T PO  TID PRN  0  . fenofibrate 160 MG tablet TK 1 T PO D  3  . gabapentin (NEURONTIN) 300 MG capsule 3 (three) times daily.   1  . gemfibrozil (LOPID) 600 MG tablet Take by mouth.    . Insulin Glargine (TOUJEO SOLOSTAR) 300 UNIT/ML SOPN Inject 500 Units into the skin every morning. And pen needles 2/day 21 pen 11  . Insulin Syringe-Needle U-100 (B-D INS SYRINGE 2CC/29GX1/2") 29G X 1/2" 2 ML MISC Used for daily insulin injections 2x. 200 each 5  . levothyroxine (SYNTHROID, LEVOTHROID) 75 MCG tablet Take 1  tablet (75 mcg total) by mouth daily. 90 tablet 3  . lisinopril (PRINIVIL,ZESTRIL) 40 MG tablet TK 1 T PO D  5  . metFORMIN (GLUCOPHAGE) 1000 MG tablet Take 1,000 mg by mouth 2 (two) times daily with a meal.    . metoprolol tartrate (LOPRESSOR) 25 MG tablet   4  . Omega-3 1000 MG CAPS Take by mouth.    . potassium chloride (K-DUR,KLOR-CON) 10 MEQ tablet Take 10 mEq by mouth 2 (two) times daily. Reported on 11/26/2015    . PROAIR HFA 108 (90 Base) MCG/ACT inhaler INL 2 PFS PO Q 6 H PRN  5  . sildenafil (REVATIO) 20 MG tablet 2-5 tabs, as needed for ED symptoms 50 tablet 11  . sulfamethoxazole-trimethoprim (BACTRIM) 400-80 MG tablet Take 1 tablet by mouth 2 (two) times daily. 28 tablet 0   No current facility-administered medications on file prior to visit.    No Known Allergies  Objective: There were no vitals filed for this visit.  General: No acute distress, AAOx3  Right foot: I&D/Medial ankle ulceration remains healed with dry scab that does have some clear to bloody drainage from this area. To the anterior shin there is a small abrasion with dry crusted skin and surrounding redness and warmth that is blanchable in nature Left leg: There is partial thickness ulcerations measuring less than 0.5 cm to anterior shin DP and PT pedal pulses nonpalpable due to extreme swelling  No pain to calf no concern for blood clot negative Homans' sign Pes planus foot type  Assessment and Plan:  Problem List Items Addressed This Visit    None    Visit Diagnoses    Venous ulcer of ankle, right (HCC)    -  Primary   Relevant Medications   doxycycline (VIBRA-TABS) 100 MG tablet   Post-operative state       PVD (peripheral vascular disease) (HCC)       Relevant Medications   doxycycline (VIBRA-TABS) 100 MG tablet   Diabetic polyneuropathy associated with type 2 diabetes mellitus (HCC)       Relevant Medications   doxycycline (VIBRA-TABS) 100 MG tablet   Venous ulcer of left leg (HCC)        Relevant Medications   doxycycline (VIBRA-TABS) 100 MG tablet   Foot pain, bilateral       Relevant Medications   doxycycline (VIBRA-TABS) 100 MG tablet   Cellulitis and abscess of foot, except toes       Relevant Medications   oxyCODONE-acetaminophen (PERCOCET) 10-325 MG tablet   doxycycline (VIBRA-TABS) 100 MG tablet      -Patient seen and evaluated -There are multiple superficial venous ulcerations/abrasions to bilateral legs -Prescribed doxycycline for patient to take for the next 10 days for possible superimposed cellulitis especially on right leg -Prescribed oxycodone for patient to take as needed for pain -Cleanse legs with wound cleanser applied a small amount of Medihoney to ulcerated areas covered with Adaptic and Unna boot dressings to the level of the knees bilateral; advised patient to keep clean dry and intact until next week -Advised patient to make appointment with his PCP for fluid management/Lasix -Return in 1 week for Unna boot dressing change or sooner if issues arise.  [name], DPM

## 2020-08-08 ENCOUNTER — Other Ambulatory Visit: Payer: Self-pay

## 2020-08-08 ENCOUNTER — Encounter: Payer: Self-pay | Admitting: Sports Medicine

## 2020-08-08 ENCOUNTER — Ambulatory Visit (INDEPENDENT_AMBULATORY_CARE_PROVIDER_SITE_OTHER): Payer: PPO | Admitting: Sports Medicine

## 2020-08-08 DIAGNOSIS — Z9889 Other specified postprocedural states: Secondary | ICD-10-CM | POA: Diagnosis not present

## 2020-08-08 DIAGNOSIS — I83029 Varicose veins of left lower extremity with ulcer of unspecified site: Secondary | ICD-10-CM

## 2020-08-08 DIAGNOSIS — L97319 Non-pressure chronic ulcer of right ankle with unspecified severity: Secondary | ICD-10-CM | POA: Diagnosis not present

## 2020-08-08 DIAGNOSIS — I739 Peripheral vascular disease, unspecified: Secondary | ICD-10-CM | POA: Diagnosis not present

## 2020-08-08 DIAGNOSIS — I83013 Varicose veins of right lower extremity with ulcer of ankle: Secondary | ICD-10-CM | POA: Diagnosis not present

## 2020-08-08 DIAGNOSIS — I872 Venous insufficiency (chronic) (peripheral): Secondary | ICD-10-CM | POA: Diagnosis not present

## 2020-08-08 DIAGNOSIS — L97929 Non-pressure chronic ulcer of unspecified part of left lower leg with unspecified severity: Secondary | ICD-10-CM | POA: Diagnosis not present

## 2020-08-08 DIAGNOSIS — E1142 Type 2 diabetes mellitus with diabetic polyneuropathy: Secondary | ICD-10-CM

## 2020-08-08 MED ORDER — FUROSEMIDE 20 MG PO TABS: 20.0000 mg | ORAL_TABLET | Freq: Every day | ORAL | 3 refills | Status: AC

## 2020-08-08 NOTE — Progress Notes (Signed)
Subjective: Scott Anderson is a 54 y.o. male patient seen today in office for POV # 24 (DOS 12-16-19), S/P R ankle I&D and wound debridement.  Reports that he was able to keep the wraps on his legs but was unable to walk reports that he has been using 2 canes and has noticed additional swelling in his belly and has been very short of breath over the last few days however states that it is getting better and reports that he has not had a chance to follow-up with his primary doctor about fluid medication.  Patient denies nausea vomiting fever chills or any other constitutional symptoms at this time.  No other issues noted.   Fasting blood sugar not recorded.   Patient Active Problem List   Diagnosis Date Noted  . Obstructive sleep apnea on CPAP 05/26/2019  . Chronic coronary artery disease 05/25/2019  . Congestive cardiomyopathy (HCC) 05/25/2019  . Congestive heart failure (HCC) 05/25/2019  . Elevated brain natriuretic peptide (BNP) level 04/29/2019  . Pituitary insufficiency (HCC) 12/11/2015  . Hypogonadism male 11/29/2015  . Erectile dysfunction 11/26/2015  . Morbid obesity (HCC) 11/26/2015  . Diabetes (HCC) 11/26/2015  . HTN (hypertension) 11/26/2015  . Hypothyroidism 11/26/2015  . Anxiety state 11/26/2015  . Hypertriglyceridemia 11/26/2015    Current Outpatient Medications on File Prior to Visit  Medication Sig Dispense Refill  . clomiPHENE (CLOMID) 50 MG tablet Take 0.5 tablets (25 mg total) by mouth daily. 10 tablet 0  . diazepam (VALIUM) 5 MG tablet TK 1 T PO  TID PRN  0  . doxycycline (VIBRA-TABS) 100 MG tablet Take 1 tablet (100 mg total) by mouth 2 (two) times daily. 20 tablet 0  . fenofibrate 160 MG tablet TK 1 T PO D  3  . gabapentin (NEURONTIN) 300 MG capsule 3 (three) times daily.   1  . gemfibrozil (LOPID) 600 MG tablet Take by mouth.    . Insulin Glargine (TOUJEO SOLOSTAR) 300 UNIT/ML SOPN Inject 500 Units into the skin every morning. And pen needles 2/day 21 pen 11  .  Insulin Syringe-Needle U-100 (B-D INS SYRINGE 2CC/29GX1/2") 29G X 1/2" 2 ML MISC Used for daily insulin injections 2x. 200 each 5  . levothyroxine (SYNTHROID, LEVOTHROID) 75 MCG tablet Take 1 tablet (75 mcg total) by mouth daily. 90 tablet 3  . lisinopril (PRINIVIL,ZESTRIL) 40 MG tablet TK 1 T PO D  5  . metFORMIN (GLUCOPHAGE) 1000 MG tablet Take 1,000 mg by mouth 2 (two) times daily with a meal.    . metoprolol tartrate (LOPRESSOR) 25 MG tablet   4  . Omega-3 1000 MG CAPS Take by mouth.    . potassium chloride (K-DUR,KLOR-CON) 10 MEQ tablet Take 10 mEq by mouth 2 (two) times daily. Reported on 11/26/2015    . PROAIR HFA 108 (90 Base) MCG/ACT inhaler INL 2 PFS PO Q 6 H PRN  5  . sildenafil (REVATIO) 20 MG tablet 2-5 tabs, as needed for ED symptoms 50 tablet 11  . sulfamethoxazole-trimethoprim (BACTRIM) 400-80 MG tablet Take 1 tablet by mouth 2 (two) times daily. 28 tablet 0   No current facility-administered medications on file prior to visit.    No Known Allergies  Objective: There were no vitals filed for this visit.  General: No acute distress, AAOx3  Right foot: I&D/Medial ankle ulceration remains healed with dry scab.  To the anterior lateral shin there is much improved abrasions with dried crusting noted. Left leg: There is partial thickness ulcerations measuring  less than 0.5 cm to anterior shin that appears to be improving as well with a granular base and very minimal fibrosis. DP and PT pedal pulses nonpalpable due to 2+ pitting swelling No pain to calf no concern for blood clot negative Homans' sign however due to increased shortness of breath and abdominal swelling concern for ascites versus CHF  Assessment and Plan:  Problem List Items Addressed This Visit    None    Visit Diagnoses    PVD (peripheral vascular disease) (HCC)    -  Primary   Relevant Medications   furosemide (LASIX) 20 MG tablet   Venous ulcer of ankle, right (HCC)       Venous ulcer of left leg (HCC)        Post-operative state       Diabetic polyneuropathy associated with type 2 diabetes mellitus (HCC)       Edema of both lower extremities due to peripheral venous insufficiency       Relevant Medications   furosemide (LASIX) 20 MG tablet      -Patient seen and evaluated -There are multiple superficial venous ulcerations/abrasions to bilateral legs that appear to be healing well with no acute signs of infection -Patient to finish out doxycycline as prescribed last visit -Prescribed Lasix 20 mg and advised patient if swelling in his belly and abdomen area and his shortness of breath worsens to go to ER immediately since I am concerned for fluid overload I also advised -Cleanse leg and applied Betadine to healing abrasions and Ace wrap for light compression to both legs for patient to adjust as needed -Return in 7 to 10 days for follow-up care or sooner if issues arise.  [name], DPM

## 2020-08-13 ENCOUNTER — Telehealth: Payer: Self-pay | Admitting: Sports Medicine

## 2020-08-13 DIAGNOSIS — N179 Acute kidney failure, unspecified: Secondary | ICD-10-CM | POA: Diagnosis not present

## 2020-08-13 DIAGNOSIS — Z6841 Body Mass Index (BMI) 40.0 and over, adult: Secondary | ICD-10-CM | POA: Diagnosis not present

## 2020-08-13 DIAGNOSIS — I110 Hypertensive heart disease with heart failure: Secondary | ICD-10-CM | POA: Diagnosis not present

## 2020-08-13 DIAGNOSIS — R0989 Other specified symptoms and signs involving the circulatory and respiratory systems: Secondary | ICD-10-CM | POA: Diagnosis not present

## 2020-08-13 DIAGNOSIS — I130 Hypertensive heart and chronic kidney disease with heart failure and stage 1 through stage 4 chronic kidney disease, or unspecified chronic kidney disease: Secondary | ICD-10-CM | POA: Diagnosis not present

## 2020-08-13 DIAGNOSIS — L304 Erythema intertrigo: Secondary | ICD-10-CM | POA: Diagnosis not present

## 2020-08-13 DIAGNOSIS — I890 Lymphedema, not elsewhere classified: Secondary | ICD-10-CM | POA: Diagnosis not present

## 2020-08-13 DIAGNOSIS — L03115 Cellulitis of right lower limb: Secondary | ICD-10-CM | POA: Diagnosis not present

## 2020-08-13 DIAGNOSIS — E039 Hypothyroidism, unspecified: Secondary | ICD-10-CM | POA: Diagnosis not present

## 2020-08-13 DIAGNOSIS — Z794 Long term (current) use of insulin: Secondary | ICD-10-CM | POA: Diagnosis not present

## 2020-08-13 DIAGNOSIS — Z9114 Patient's other noncompliance with medication regimen: Secondary | ICD-10-CM | POA: Diagnosis not present

## 2020-08-13 DIAGNOSIS — F32A Depression, unspecified: Secondary | ICD-10-CM | POA: Diagnosis not present

## 2020-08-13 DIAGNOSIS — Z79899 Other long term (current) drug therapy: Secondary | ICD-10-CM | POA: Diagnosis not present

## 2020-08-13 DIAGNOSIS — A419 Sepsis, unspecified organism: Secondary | ICD-10-CM | POA: Diagnosis not present

## 2020-08-13 DIAGNOSIS — N170 Acute kidney failure with tubular necrosis: Secondary | ICD-10-CM | POA: Diagnosis not present

## 2020-08-13 DIAGNOSIS — N189 Chronic kidney disease, unspecified: Secondary | ICD-10-CM | POA: Diagnosis not present

## 2020-08-13 DIAGNOSIS — I5023 Acute on chronic systolic (congestive) heart failure: Secondary | ICD-10-CM | POA: Diagnosis not present

## 2020-08-13 DIAGNOSIS — R600 Localized edema: Secondary | ICD-10-CM | POA: Diagnosis not present

## 2020-08-13 DIAGNOSIS — F419 Anxiety disorder, unspecified: Secondary | ICD-10-CM | POA: Diagnosis not present

## 2020-08-13 DIAGNOSIS — N5089 Other specified disorders of the male genital organs: Secondary | ICD-10-CM | POA: Diagnosis not present

## 2020-08-13 DIAGNOSIS — I361 Nonrheumatic tricuspid (valve) insufficiency: Secondary | ICD-10-CM | POA: Diagnosis not present

## 2020-08-13 DIAGNOSIS — M1990 Unspecified osteoarthritis, unspecified site: Secondary | ICD-10-CM | POA: Diagnosis not present

## 2020-08-13 DIAGNOSIS — L03119 Cellulitis of unspecified part of limb: Secondary | ICD-10-CM | POA: Diagnosis not present

## 2020-08-13 DIAGNOSIS — N183 Chronic kidney disease, stage 3 unspecified: Secondary | ICD-10-CM | POA: Diagnosis not present

## 2020-08-13 DIAGNOSIS — I509 Heart failure, unspecified: Secondary | ICD-10-CM | POA: Diagnosis not present

## 2020-08-13 DIAGNOSIS — E1165 Type 2 diabetes mellitus with hyperglycemia: Secondary | ICD-10-CM | POA: Diagnosis not present

## 2020-08-13 DIAGNOSIS — E1122 Type 2 diabetes mellitus with diabetic chronic kidney disease: Secondary | ICD-10-CM | POA: Diagnosis not present

## 2020-08-13 NOTE — Telephone Encounter (Signed)
He should stop the antibiotic and take benadryl if reaction continues he should go to ER. Patient had swelling on Friday and was told that if swelling continues he should go to ER because I was concerned for fluid overload. Joni Reining will you please call the patient to let him know this. Thanks Dr. Marylene Land

## 2020-08-13 NOTE — Telephone Encounter (Signed)
Per symptoms and Dr. Wynema Birch last note this does appear concerning for fluid overload. Advised he proceed to the ED for eval and possible diuresis. Patient verbalized understanding.

## 2020-08-13 NOTE — Telephone Encounter (Signed)
The patient is having a really bad reaction to the antibiotic prescribed, experiencing swelling, hives, stomach is distended, testicle swelling.

## 2020-08-14 DIAGNOSIS — I890 Lymphedema, not elsewhere classified: Secondary | ICD-10-CM

## 2020-08-14 DIAGNOSIS — I5023 Acute on chronic systolic (congestive) heart failure: Secondary | ICD-10-CM

## 2020-08-14 DIAGNOSIS — L03119 Cellulitis of unspecified part of limb: Secondary | ICD-10-CM

## 2020-08-14 DIAGNOSIS — E1165 Type 2 diabetes mellitus with hyperglycemia: Secondary | ICD-10-CM | POA: Diagnosis not present

## 2020-08-14 DIAGNOSIS — I509 Heart failure, unspecified: Secondary | ICD-10-CM | POA: Diagnosis not present

## 2020-08-15 DIAGNOSIS — I509 Heart failure, unspecified: Secondary | ICD-10-CM | POA: Diagnosis not present

## 2020-08-15 DIAGNOSIS — E1165 Type 2 diabetes mellitus with hyperglycemia: Secondary | ICD-10-CM | POA: Diagnosis not present

## 2020-08-16 DIAGNOSIS — E1165 Type 2 diabetes mellitus with hyperglycemia: Secondary | ICD-10-CM | POA: Diagnosis not present

## 2020-08-16 DIAGNOSIS — I509 Heart failure, unspecified: Secondary | ICD-10-CM | POA: Diagnosis not present

## 2020-08-17 DIAGNOSIS — I509 Heart failure, unspecified: Secondary | ICD-10-CM | POA: Diagnosis not present

## 2020-08-17 DIAGNOSIS — E1165 Type 2 diabetes mellitus with hyperglycemia: Secondary | ICD-10-CM | POA: Diagnosis not present

## 2020-08-18 DIAGNOSIS — I5023 Acute on chronic systolic (congestive) heart failure: Secondary | ICD-10-CM | POA: Diagnosis not present

## 2020-08-18 DIAGNOSIS — I110 Hypertensive heart disease with heart failure: Secondary | ICD-10-CM | POA: Diagnosis not present

## 2020-08-18 DIAGNOSIS — N183 Chronic kidney disease, stage 3 unspecified: Secondary | ICD-10-CM

## 2020-08-18 DIAGNOSIS — E1165 Type 2 diabetes mellitus with hyperglycemia: Secondary | ICD-10-CM | POA: Diagnosis not present

## 2020-08-19 DIAGNOSIS — I5023 Acute on chronic systolic (congestive) heart failure: Secondary | ICD-10-CM | POA: Diagnosis not present

## 2020-08-19 DIAGNOSIS — E1165 Type 2 diabetes mellitus with hyperglycemia: Secondary | ICD-10-CM | POA: Diagnosis not present

## 2020-08-19 DIAGNOSIS — I110 Hypertensive heart disease with heart failure: Secondary | ICD-10-CM | POA: Diagnosis not present

## 2020-08-20 DIAGNOSIS — I5023 Acute on chronic systolic (congestive) heart failure: Secondary | ICD-10-CM | POA: Diagnosis not present

## 2020-08-20 DIAGNOSIS — I110 Hypertensive heart disease with heart failure: Secondary | ICD-10-CM | POA: Diagnosis not present

## 2020-08-20 DIAGNOSIS — E1165 Type 2 diabetes mellitus with hyperglycemia: Secondary | ICD-10-CM | POA: Diagnosis not present

## 2020-08-21 DIAGNOSIS — E1165 Type 2 diabetes mellitus with hyperglycemia: Secondary | ICD-10-CM | POA: Diagnosis not present

## 2020-08-21 DIAGNOSIS — I110 Hypertensive heart disease with heart failure: Secondary | ICD-10-CM | POA: Diagnosis not present

## 2020-08-21 DIAGNOSIS — I5023 Acute on chronic systolic (congestive) heart failure: Secondary | ICD-10-CM | POA: Diagnosis not present

## 2020-08-28 DIAGNOSIS — J449 Chronic obstructive pulmonary disease, unspecified: Secondary | ICD-10-CM | POA: Diagnosis not present

## 2020-09-19 ENCOUNTER — Encounter: Payer: Self-pay | Admitting: Sports Medicine

## 2020-09-28 DIAGNOSIS — J449 Chronic obstructive pulmonary disease, unspecified: Secondary | ICD-10-CM | POA: Diagnosis not present

## 2020-10-29 DIAGNOSIS — J449 Chronic obstructive pulmonary disease, unspecified: Secondary | ICD-10-CM | POA: Diagnosis not present

## 2020-11-20 ENCOUNTER — Encounter: Payer: Self-pay | Admitting: Sports Medicine

## 2020-11-20 ENCOUNTER — Ambulatory Visit: Payer: PPO | Admitting: Sports Medicine

## 2020-11-20 ENCOUNTER — Other Ambulatory Visit: Payer: Self-pay

## 2020-11-20 DIAGNOSIS — Z9119 Patient's noncompliance with other medical treatment and regimen: Secondary | ICD-10-CM | POA: Insufficient documentation

## 2020-11-20 DIAGNOSIS — Z91199 Patient's noncompliance with other medical treatment and regimen due to unspecified reason: Secondary | ICD-10-CM | POA: Insufficient documentation

## 2020-11-20 DIAGNOSIS — M1710 Unilateral primary osteoarthritis, unspecified knee: Secondary | ICD-10-CM | POA: Insufficient documentation

## 2020-11-20 DIAGNOSIS — E119 Type 2 diabetes mellitus without complications: Secondary | ICD-10-CM | POA: Insufficient documentation

## 2020-11-20 DIAGNOSIS — M179 Osteoarthritis of knee, unspecified: Secondary | ICD-10-CM | POA: Insufficient documentation

## 2020-11-20 DIAGNOSIS — M79674 Pain in right toe(s): Secondary | ICD-10-CM | POA: Diagnosis not present

## 2020-11-20 DIAGNOSIS — M79675 Pain in left toe(s): Secondary | ICD-10-CM | POA: Diagnosis not present

## 2020-11-20 DIAGNOSIS — I739 Peripheral vascular disease, unspecified: Secondary | ICD-10-CM

## 2020-11-20 DIAGNOSIS — E1142 Type 2 diabetes mellitus with diabetic polyneuropathy: Secondary | ICD-10-CM

## 2020-11-20 DIAGNOSIS — F39 Unspecified mood [affective] disorder: Secondary | ICD-10-CM | POA: Insufficient documentation

## 2020-11-20 DIAGNOSIS — B351 Tinea unguium: Secondary | ICD-10-CM

## 2020-11-20 DIAGNOSIS — I10 Essential (primary) hypertension: Secondary | ICD-10-CM | POA: Insufficient documentation

## 2020-11-20 DIAGNOSIS — R600 Localized edema: Secondary | ICD-10-CM | POA: Insufficient documentation

## 2020-11-20 DIAGNOSIS — F411 Generalized anxiety disorder: Secondary | ICD-10-CM | POA: Insufficient documentation

## 2020-11-20 DIAGNOSIS — Z789 Other specified health status: Secondary | ICD-10-CM | POA: Insufficient documentation

## 2020-11-20 DIAGNOSIS — S80819A Abrasion, unspecified lower leg, initial encounter: Secondary | ICD-10-CM

## 2020-11-20 NOTE — Progress Notes (Signed)
Subjective: Scott Anderson is a 55 y.o. male patient with history of diabetes who presents to office today complaining of long,mildly painful nails  while ambulating in shoes; unable to trim. Patient states that the glucose reading this morning was not recorded. Patient denies any new changes in medication or new problems except scrapes on legs.  Patient Active Problem List   Diagnosis Date Noted  . Admits to alcohol consumption 11/20/2020  . Benign essential hypertension 11/20/2020  . Generalized anxiety disorder 11/20/2020  . Mood disorder (HCC) 11/20/2020  . Noncompliance with treatment 11/20/2020  . Osteoarthritis of knee 11/20/2020  . Pedal edema 11/20/2020  . Type 2 diabetes mellitus without complication (HCC) 11/20/2020  . Obstructive sleep apnea on CPAP 05/26/2019  . Chronic coronary artery disease 05/25/2019  . Congestive cardiomyopathy (HCC) 05/25/2019  . Congestive heart failure (HCC) 05/25/2019  . Elevated brain natriuretic peptide (BNP) level 04/29/2019  . Pituitary insufficiency (HCC) 12/11/2015  . Hypogonadism male 11/29/2015  . Erectile dysfunction 11/26/2015  . Morbid obesity (HCC) 11/26/2015  . Diabetes (HCC) 11/26/2015  . HTN (hypertension) 11/26/2015  . Hypothyroidism 11/26/2015  . Anxiety state 11/26/2015  . Hypertriglyceridemia 11/26/2015   Current Outpatient Medications on File Prior to Visit  Medication Sig Dispense Refill  . divalproex (DEPAKOTE ER) 250 MG 24 hr tablet TAKE THREE TABLETS BY MOUTH AT BEDTIME FOR MOOD STABILIZATION    . insulin aspart protamine - aspart (NOVOLOG MIX 70/30 FLEXPEN) (70-30) 100 UNIT/ML FlexPen INJECT 100 UNITS SUBCUTANEOUSLY TWICE A DAY BEFORE MEALS    . rosuvastatin (CRESTOR) 40 MG tablet TAKE ONE-HALF TABLET BY MOUTH ONCE A DAY FOR CHOLESTEROL    . testosterone cypionate (DEPOTESTOSTERONE CYPIONATE) 200 MG/ML injection INJECT 0.2 MLS INTRAMUSCULARLY EVERY 7 DAYS    . citalopram (CELEXA) 10 MG tablet TAKE 1 TABLET BY MOUTH  DAILY FOR DEPRESSION    . clomiPHENE (CLOMID) 50 MG tablet Take 0.5 tablets (25 mg total) by mouth daily. 10 tablet 0  . diazepam (VALIUM) 5 MG tablet TK 1 T PO  TID PRN  0  . digoxin (LANOXIN) 0.125 MG tablet Take 125 mcg by mouth daily.    Marland Kitchen doxycycline (VIBRA-TABS) 100 MG tablet Take 1 tablet (100 mg total) by mouth 2 (two) times daily. 20 tablet 0  . fenofibrate 160 MG tablet TK 1 T PO D  3  . furosemide (LASIX) 20 MG tablet Take 1 tablet (20 mg total) by mouth daily. 30 tablet 3  . gabapentin (NEURONTIN) 100 MG capsule TAKE 2 CAPSULE BY MOUTH THREE TIMES DAILY    . gabapentin (NEURONTIN) 300 MG capsule 3 (three) times daily.   1  . gemfibrozil (LOPID) 600 MG tablet Take by mouth.    . Insulin Glargine (TOUJEO SOLOSTAR) 300 UNIT/ML SOPN Inject 500 Units into the skin every morning. And pen needles 2/day 21 pen 11  . Insulin Syringe-Needle U-100 (B-D INS SYRINGE 2CC/29GX1/2") 29G X 1/2" 2 ML MISC Used for daily insulin injections 2x. 200 each 5  . levothyroxine (SYNTHROID, LEVOTHROID) 75 MCG tablet Take 1 tablet (75 mcg total) by mouth daily. 90 tablet 3  . lisinopril (PRINIVIL,ZESTRIL) 40 MG tablet TK 1 T PO D  5  . metFORMIN (GLUCOPHAGE) 1000 MG tablet Take 1,000 mg by mouth 2 (two) times daily with a meal.    . metoprolol tartrate (LOPRESSOR) 25 MG tablet   4  . NYSTATIN powder APPLY TOPICALLY TO THE AFFECTED AREA DAILY    . Omega-3 1000 MG CAPS Take  by mouth.    . potassium chloride (K-DUR,KLOR-CON) 10 MEQ tablet Take 10 mEq by mouth 2 (two) times daily. Reported on 11/26/2015    . PROAIR HFA 108 (90 Base) MCG/ACT inhaler INL 2 PFS PO Q 6 H PRN  5  . sildenafil (REVATIO) 20 MG tablet 2-5 tabs, as needed for ED symptoms 50 tablet 11  . sulfamethoxazole-trimethoprim (BACTRIM) 400-80 MG tablet Take 1 tablet by mouth 2 (two) times daily. 28 tablet 0  . torsemide (DEMADEX) 20 MG tablet Take 20 mg by mouth 2 (two) times daily.    . valsartan (DIOVAN) 80 MG tablet Take 80 mg by mouth 2 (two)  times daily.     No current facility-administered medications on file prior to visit.   No Known Allergies  No results found for this or any previous visit (from the past 2160 hour(s)).  Objective: General: Patient is awake, alert, and oriented x 3 and in no acute distress.  Integument: Skin is warm, dry and supple bilateral. Nails are tender, long, thickened and  dystrophic with subungual debris, consistent with onychomycosis, 1-5 bilateral. No signs of infection. + abrasion to legs bilateral with no acute signs of infection. No open lesions or preulcerative lesions present bilateral. Remaining integument unremarkable.  Vasculature:  Dorsalis Pedis pulse 1/4 bilateral. Posterior Tibial pulse 0/4 bilateral.  Capillary fill time <3 sec 1-5 bilateral.No hair growth to the level of the digits. Temperature gradient within normal limits. + varicosities present bilateral. + 1 pitting edema present bilateral.   Neurology: The patient has diminished pedal sensation bilateral.   Musculoskeletal: Aymptomatic pes planus pedal deformities noted bilateral. Muscular strength 5/5 in all lower extremity muscular groups bilateral without pain on range of motion . No tenderness with calf compression bilateral.  Assessment and Plan: Problem List Items Addressed This Visit   None   Visit Diagnoses    Pain due to onychomycosis of toenails of both feet    -  Primary   Relevant Medications   NYSTATIN powder   PVD (peripheral vascular disease) (HCC)       Relevant Medications   digoxin (LANOXIN) 0.125 MG tablet   rosuvastatin (CRESTOR) 40 MG tablet   torsemide (DEMADEX) 20 MG tablet   valsartan (DIOVAN) 80 MG tablet   Diabetic polyneuropathy associated with type 2 diabetes mellitus (HCC)       Relevant Medications   citalopram (CELEXA) 10 MG tablet   divalproex (DEPAKOTE ER) 250 MG 24 hr tablet   gabapentin (NEURONTIN) 100 MG capsule   insulin aspart protamine - aspart (NOVOLOG MIX 70/30 FLEXPEN)  (70-30) 100 UNIT/ML FlexPen   rosuvastatin (CRESTOR) 40 MG tablet   valsartan (DIOVAN) 80 MG tablet   Abrasion, leg w/o infection          -Examined patient. -Discussed and educated patient on diabetic foot care, especially with  regards to the vascular, neurological and musculoskeletal systems.  -Stressed the importance of good glycemic control and the detriment of not  controlling glucose levels in relation to the foot. -Mechanically debrided all nails 1-5 bilateral using sterile nail nipper and filed with dremel without incident  -Applied antibiotic cream to abrasions to legs and advised patient to do the same until resolved -Answered all patient questions -Patient to return  in 3 months for at risk foot care -Patient advised to call the office if any problems or questions arise in the meantime.  [name], DPM

## 2020-11-26 DIAGNOSIS — J449 Chronic obstructive pulmonary disease, unspecified: Secondary | ICD-10-CM | POA: Diagnosis not present

## 2020-12-27 DIAGNOSIS — J449 Chronic obstructive pulmonary disease, unspecified: Secondary | ICD-10-CM | POA: Diagnosis not present

## 2021-01-26 DIAGNOSIS — J449 Chronic obstructive pulmonary disease, unspecified: Secondary | ICD-10-CM | POA: Diagnosis not present

## 2021-02-20 ENCOUNTER — Encounter: Payer: Self-pay | Admitting: Sports Medicine

## 2021-02-20 ENCOUNTER — Other Ambulatory Visit: Payer: Self-pay

## 2021-02-20 ENCOUNTER — Ambulatory Visit: Payer: PPO | Admitting: Sports Medicine

## 2021-02-20 DIAGNOSIS — E1142 Type 2 diabetes mellitus with diabetic polyneuropathy: Secondary | ICD-10-CM | POA: Diagnosis not present

## 2021-02-20 DIAGNOSIS — M79675 Pain in left toe(s): Secondary | ICD-10-CM

## 2021-02-20 DIAGNOSIS — I739 Peripheral vascular disease, unspecified: Secondary | ICD-10-CM | POA: Diagnosis not present

## 2021-02-20 DIAGNOSIS — M79674 Pain in right toe(s): Secondary | ICD-10-CM

## 2021-02-20 DIAGNOSIS — B351 Tinea unguium: Secondary | ICD-10-CM | POA: Diagnosis not present

## 2021-02-20 NOTE — Progress Notes (Signed)
Subjective: Scott Anderson is a 55 y.o. male patient with history of diabetes who presents to office today complaining of long,mildly painful nails  while ambulating in shoes; unable to trim. Patient states that the glucose reading this morning was not recorded. Patient denies any new changes in medication or new problems.  Patient Active Problem List   Diagnosis Date Noted   Admits to alcohol consumption 11/20/2020   Benign essential hypertension 11/20/2020   Generalized anxiety disorder 11/20/2020   Mood disorder (HCC) 11/20/2020   Noncompliance with treatment 11/20/2020   Osteoarthritis of knee 11/20/2020   Pedal edema 11/20/2020   Type 2 diabetes mellitus without complication (HCC) 11/20/2020   Obstructive sleep apnea on CPAP 05/26/2019   Chronic coronary artery disease 05/25/2019   Congestive cardiomyopathy (HCC) 05/25/2019   Congestive heart failure (HCC) 05/25/2019   Elevated brain natriuretic peptide (BNP) level 04/29/2019   Pituitary insufficiency (HCC) 12/11/2015   Hypogonadism male 11/29/2015   Erectile dysfunction 11/26/2015   Morbid obesity (HCC) 11/26/2015   Diabetes (HCC) 11/26/2015   HTN (hypertension) 11/26/2015   Hypothyroidism 11/26/2015   Anxiety state 11/26/2015   Hypertriglyceridemia 11/26/2015   Current Outpatient Medications on File Prior to Visit  Medication Sig Dispense Refill   citalopram (CELEXA) 10 MG tablet TAKE 1 TABLET BY MOUTH DAILY FOR DEPRESSION     clomiPHENE (CLOMID) 50 MG tablet Take 0.5 tablets (25 mg total) by mouth daily. 10 tablet 0   diazepam (VALIUM) 5 MG tablet TK 1 T PO  TID PRN  0   digoxin (LANOXIN) 0.125 MG tablet Take 125 mcg by mouth daily.     divalproex (DEPAKOTE ER) 250 MG 24 hr tablet TAKE THREE TABLETS BY MOUTH AT BEDTIME FOR MOOD STABILIZATION     doxycycline (VIBRA-TABS) 100 MG tablet Take 1 tablet (100 mg total) by mouth 2 (two) times daily. 20 tablet 0   fenofibrate 160 MG tablet TK 1 T PO D  3   furosemide (LASIX) 20 MG  tablet Take 1 tablet (20 mg total) by mouth daily. 30 tablet 3   gabapentin (NEURONTIN) 100 MG capsule TAKE 2 CAPSULE BY MOUTH THREE TIMES DAILY     gabapentin (NEURONTIN) 300 MG capsule 3 (three) times daily.   1   gemfibrozil (LOPID) 600 MG tablet Take by mouth.     insulin aspart protamine - aspart (NOVOLOG MIX 70/30 FLEXPEN) (70-30) 100 UNIT/ML FlexPen INJECT 100 UNITS SUBCUTANEOUSLY TWICE A DAY BEFORE MEALS     Insulin Glargine (TOUJEO SOLOSTAR) 300 UNIT/ML SOPN Inject 500 Units into the skin every morning. And pen needles 2/day 21 pen 11   Insulin Syringe-Needle U-100 (B-D INS SYRINGE 2CC/29GX1/2") 29G X 1/2" 2 ML MISC Used for daily insulin injections 2x. 200 each 5   levothyroxine (SYNTHROID, LEVOTHROID) 75 MCG tablet Take 1 tablet (75 mcg total) by mouth daily. 90 tablet 3   lisinopril (PRINIVIL,ZESTRIL) 40 MG tablet TK 1 T PO D  5   metFORMIN (GLUCOPHAGE) 1000 MG tablet Take 1,000 mg by mouth 2 (two) times daily with a meal.     metoprolol tartrate (LOPRESSOR) 25 MG tablet   4   NYSTATIN powder APPLY TOPICALLY TO THE AFFECTED AREA DAILY     Omega-3 1000 MG CAPS Take by mouth.     potassium chloride (K-DUR,KLOR-CON) 10 MEQ tablet Take 10 mEq by mouth 2 (two) times daily. Reported on 11/26/2015     PROAIR HFA 108 (90 Base) MCG/ACT inhaler INL 2 PFS PO Q 6  H PRN  5   rosuvastatin (CRESTOR) 40 MG tablet TAKE ONE-HALF TABLET BY MOUTH ONCE A DAY FOR CHOLESTEROL     sildenafil (REVATIO) 20 MG tablet 2-5 tabs, as needed for ED symptoms 50 tablet 11   sulfamethoxazole-trimethoprim (BACTRIM) 400-80 MG tablet Take 1 tablet by mouth 2 (two) times daily. 28 tablet 0   testosterone cypionate (DEPOTESTOSTERONE CYPIONATE) 200 MG/ML injection INJECT 0.2 MLS INTRAMUSCULARLY EVERY 7 DAYS     torsemide (DEMADEX) 20 MG tablet Take 20 mg by mouth 2 (two) times daily.     valsartan (DIOVAN) 80 MG tablet Take 80 mg by mouth 2 (two) times daily.     No current facility-administered medications on file prior to  visit.   No Known Allergies  No results found for this or any previous visit (from the past 2160 hour(s)).  Objective: General: Patient is awake, alert, and oriented x 3 and in no acute distress.  Integument: Skin is warm, dry and supple bilateral. Nails are tender, long, thickened and  dystrophic with subungual debris, consistent with onychomycosis, 1-5 bilateral. No signs of infection. + dry scabs/abrasion to legs bilateral with no acute signs of infection. No open lesions or preulcerative lesions present bilateral. Remaining integument unremarkable.  Vasculature:  Dorsalis Pedis pulse 1/4 bilateral. Posterior Tibial pulse 0/4 bilateral.  Capillary fill time <3 sec 1-5 bilateral.No hair growth to the level of the digits. Temperature gradient within normal limits. + varicosities present bilateral. + 1 pitting edema present bilateral.   Neurology: The patient has diminished pedal sensation bilateral.   Musculoskeletal: Aymptomatic pes planus pedal deformities noted bilateral. Muscular strength 5/5 in all lower extremity muscular groups bilateral without pain on range of motion . No tenderness with calf compression bilateral.  Assessment and Plan: Problem List Items Addressed This Visit   None Visit Diagnoses     Pain due to onychomycosis of toenails of both feet    -  Primary   PVD (peripheral vascular disease) (HCC)       Diabetic polyneuropathy associated with type 2 diabetes mellitus (HCC)           -Examined patient. -Discussed and educated patient on diabetic foot care, especially with  regards to the vascular, neurological and musculoskeletal systems.  -Mechanically debrided all nails 1-5 bilateral using sterile nail nipper and filed with dremel without incident  -Advised patient to monitor his legs -Answered all patient questions -Patient to return  in 3 months for at risk foot care -Patient advised to call the office if any problems or questions arise in the  meantime.  [name], DPM

## 2021-05-24 ENCOUNTER — Ambulatory Visit: Payer: PPO | Admitting: Sports Medicine

## 2021-12-17 ENCOUNTER — Ambulatory Visit: Payer: PPO | Admitting: Sports Medicine

## 2021-12-27 ENCOUNTER — Ambulatory Visit (INDEPENDENT_AMBULATORY_CARE_PROVIDER_SITE_OTHER): Payer: PPO | Admitting: Sports Medicine

## 2021-12-27 ENCOUNTER — Encounter: Payer: Self-pay | Admitting: Sports Medicine

## 2021-12-27 DIAGNOSIS — M79674 Pain in right toe(s): Secondary | ICD-10-CM | POA: Diagnosis not present

## 2021-12-27 DIAGNOSIS — B351 Tinea unguium: Secondary | ICD-10-CM | POA: Diagnosis not present

## 2021-12-27 DIAGNOSIS — N1831 Chronic kidney disease, stage 3a: Secondary | ICD-10-CM | POA: Insufficient documentation

## 2021-12-27 DIAGNOSIS — M79675 Pain in left toe(s): Secondary | ICD-10-CM

## 2021-12-27 DIAGNOSIS — M5451 Vertebrogenic low back pain: Secondary | ICD-10-CM | POA: Insufficient documentation

## 2021-12-27 DIAGNOSIS — I739 Peripheral vascular disease, unspecified: Secondary | ICD-10-CM

## 2021-12-27 DIAGNOSIS — I5022 Chronic systolic (congestive) heart failure: Secondary | ICD-10-CM | POA: Insufficient documentation

## 2021-12-27 DIAGNOSIS — H6123 Impacted cerumen, bilateral: Secondary | ICD-10-CM | POA: Insufficient documentation

## 2021-12-27 DIAGNOSIS — Z23 Encounter for immunization: Secondary | ICD-10-CM | POA: Insufficient documentation

## 2021-12-27 DIAGNOSIS — E1142 Type 2 diabetes mellitus with diabetic polyneuropathy: Secondary | ICD-10-CM

## 2021-12-27 DIAGNOSIS — S80819A Abrasion, unspecified lower leg, initial encounter: Secondary | ICD-10-CM

## 2021-12-27 NOTE — Progress Notes (Signed)
Subjective: ?Scott Anderson is a 56 y.o. male patient with history of diabetes who presents to office today complaining of long,mildly painful nails  while ambulating in shoes; unable to trim. Patient states that the glucose reading this morning was not recorded. Patient denies any new changes in medication or new problems except having a scrape on the right leg that he has been treating with antibiotic ointment. ? ?Patient Active Problem List  ? Diagnosis Date Noted  ? Chronic kidney disease, stage 3a ([place]) 12/27/2021  ? Chronic systolic heart failure ([place]) 12/27/2021  ? Impacted cerumen, bilateral 12/27/2021  ? Encounter for immunization 12/27/2021  ? Vertebrogenic low back pain 12/27/2021  ? Admits to alcohol consumption 11/20/2020  ? Benign essential hypertension 11/20/2020  ? Generalized anxiety disorder 11/20/2020  ? Mood disorder ([place]) 11/20/2020  ? Noncompliance with treatment 11/20/2020  ? Osteoarthritis of knee 11/20/2020  ? Pedal edema 11/20/2020  ? Type 2 diabetes mellitus without complication ([place]) 123456  ? Obstructive sleep apnea on CPAP 05/26/2019  ? Chronic coronary artery disease 05/25/2019  ? Congestive cardiomyopathy ([place]) 05/25/2019  ? Congestive heart failure ([place]) 05/25/2019  ? Elevated brain natriuretic peptide (BNP) level 04/29/2019  ? Pituitary insufficiency ([place]) 12/11/2015  ? Hypogonadism male 11/29/2015  ? Erectile dysfunction 11/26/2015  ? Morbid obesity ([place]) 11/26/2015  ? Diabetes ([place]) 11/26/2015  ? HTN (hypertension) 11/26/2015  ? Hypothyroidism 11/26/2015  ? Anxiety state 11/26/2015  ? Hypertriglyceridemia 11/26/2015  ? ?Current Outpatient Medications on File Prior to Visit  ?Medication Sig Dispense Refill  ? busPIRone (BUSPAR) 10 MG tablet TAKE ONE TABLET BY MOUTH THREE TIMES A DAY AS NEEDED FOR ANXIETY AS NEEDED    ? Cholecalciferol 25 MCG (1000 UT) tablet TAKE ONE TABLET BY MOUTH DAILY FOR LOW VIT D LEVEL    ? divalproex (DEPAKOTE ER) 250 MG 24 hr tablet TAKE THREE TABLETS  BY MOUTH AT BEDTIME FOR MOOD STABILIZATION    ? gabapentin (NEURONTIN) 400 MG capsule Take 1 capsule by mouth 3 (three) times daily.    ? metFORMIN (GLUCOPHAGE-XR) 500 MG 24 hr tablet TAKE TWO TABLETS BY MOUTH TWICE A DAY FOR DIABETES (ANNUAL KIDNEY FUNCTION TESTING IS NEEDED)    ? metoprolol succinate (TOPROL-XL) 100 MG 24 hr tablet Take 0.5 tablets by mouth daily.    ? sacubitril-valsartan (ENTRESTO) 24-26 MG Take 1 tablet by mouth 2 (two) times daily.    ? citalopram (CELEXA) 10 MG tablet TAKE 1 TABLET BY MOUTH DAILY FOR DEPRESSION    ? clomiPHENE (CLOMID) 50 MG tablet Take 0.5 tablets (25 mg total) by mouth daily. 10 tablet 0  ? diazepam (VALIUM) 5 MG tablet TK 1 T PO  TID PRN  0  ? digoxin (LANOXIN) 0.125 MG tablet Take 125 mcg by mouth daily.    ? divalproex (DEPAKOTE ER) 250 MG 24 hr tablet TAKE THREE TABLETS BY MOUTH AT BEDTIME FOR MOOD STABILIZATION    ? doxycycline (VIBRA-TABS) 100 MG tablet Take 1 tablet (100 mg total) by mouth 2 (two) times daily. 20 tablet 0  ? fenofibrate 160 MG tablet TK 1 T PO D  3  ? furosemide (LASIX) 20 MG tablet Take 1 tablet (20 mg total) by mouth daily. 30 tablet 3  ? gabapentin (NEURONTIN) 100 MG capsule TAKE 2 CAPSULE BY MOUTH THREE TIMES DAILY    ? gabapentin (NEURONTIN) 300 MG capsule 3 (three) times daily.   1  ? gemfibrozil (LOPID) 600 MG tablet Take by mouth.    ?  insulin aspart protamine - aspart (NOVOLOG MIX 70/30 FLEXPEN) (70-30) 100 UNIT/ML FlexPen INJECT 100 UNITS SUBCUTANEOUSLY TWICE A DAY BEFORE MEALS    ? Insulin Glargine (TOUJEO SOLOSTAR) 300 UNIT/ML SOPN Inject 500 Units into the skin every morning. And pen needles 2/day 21 pen 11  ? Insulin Syringe-Needle U-100 (B-D INS SYRINGE 2CC/29GX1/2") 29G X 1/2" 2 ML MISC Used for daily insulin injections 2x. 200 each 5  ? levothyroxine (SYNTHROID, LEVOTHROID) 75 MCG tablet Take 1 tablet (75 mcg total) by mouth daily. 90 tablet 3  ? lisinopril (PRINIVIL,ZESTRIL) 40 MG tablet TK 1 T PO D  5  ? metFORMIN (GLUCOPHAGE) 1000  MG tablet Take 1,000 mg by mouth 2 (two) times daily with a meal.    ? metoprolol tartrate (LOPRESSOR) 25 MG tablet   4  ? NYSTATIN powder APPLY TOPICALLY TO THE AFFECTED AREA DAILY    ? Omega-3 1000 MG CAPS Take by mouth.    ? potassium chloride (K-DUR,KLOR-CON) 10 MEQ tablet Take 10 mEq by mouth 2 (two) times daily. Reported on 11/26/2015    ? PROAIR HFA 108 (90 Base) MCG/ACT inhaler INL 2 PFS PO Q 6 H PRN  5  ? rosuvastatin (CRESTOR) 40 MG tablet TAKE ONE-HALF TABLET BY MOUTH ONCE A DAY FOR CHOLESTEROL    ? sildenafil (REVATIO) 20 MG tablet 2-5 tabs, as needed for ED symptoms 50 tablet 11  ? sulfamethoxazole-trimethoprim (BACTRIM) 400-80 MG tablet Take 1 tablet by mouth 2 (two) times daily. 28 tablet 0  ? testosterone cypionate (DEPOTESTOSTERONE CYPIONATE) 200 MG/ML injection INJECT 0.2 MLS INTRAMUSCULARLY EVERY 7 DAYS    ? torsemide (DEMADEX) 20 MG tablet Take 20 mg by mouth 2 (two) times daily.    ? valsartan (DIOVAN) 80 MG tablet Take 80 mg by mouth 2 (two) times daily.    ? ?No current facility-administered medications on file prior to visit.  ? ?No Known Allergies ? ?No results found for this or any previous visit (from the past 2160 hour(s)). ? ?Objective: ?General: Patient is awake, alert, and oriented x 3 and in no acute distress. ? ?Integument: Skin is warm, dry and supple bilateral. Nails are tender, long, thickened and  ?dystrophic with subungual debris, consistent with onychomycosis, 1-5 bilateral. No signs of infection. + dry scabs/abrasion to legs bilateral with no acute signs of infection but there is clear oozing noted from the right medial leg. No open lesions or preulcerative lesions present bilateral. Remaining integument unremarkable. ? ?Vasculature:  Dorsalis Pedis pulse 0/4 bilateral. Posterior Tibial pulse 0/4 bilateral. Capillary fill time <5 sec 1-5 bilateral.No hair growth to the level of the digits.  Chronic trophic skin changes noted bilateral. ?Temperature gradient within normal  limits. + varicosities present bilateral. + 1 pitting edema present bilateral.  ? ?Neurology: The patient has diminished pedal sensation bilateral.  ? ?Musculoskeletal: Aymptomatic pes planus pedal deformities noted bilateral. Muscular strength 5/5 in all lower extremity muscular groups bilateral without pain on range of motion . No tenderness with calf compression bilateral. ? ?Assessment and Plan: ?Problem List Items Addressed This Visit   ?None ?Visit Diagnoses   ? ? Pain due to onychomycosis of toenails of both feet    -  Primary  ? PVD (peripheral vascular disease) ([place])      ? Relevant Medications  ? metoprolol succinate (TOPROL-XL) 100 MG 24 hr tablet  ? sacubitril-valsartan (ENTRESTO) 24-26 MG  ? Abrasion, leg w/o infection      ? Diabetic polyneuropathy associated with type 2  diabetes mellitus ([place])      ? Relevant Medications  ? busPIRone (BUSPAR) 10 MG tablet  ? divalproex (DEPAKOTE ER) 250 MG 24 hr tablet  ? gabapentin (NEURONTIN) 400 MG capsule  ? metFORMIN (GLUCOPHAGE-XR) 500 MG 24 hr tablet  ? ?  ? ? ?-Examined patient. ?-Discussed and educated patient on diabetic foot care, especially with  ?regards to the vascular, neurological and musculoskeletal systems.  ?-Mechanically debrided all painful nails 1-5 bilateral using sterile nail nipper and filed with dremel without incident  ?-Advised patient to monitor his legs and to apply a small amount of Betadine to the abrasion that has the clear drainage from it if area worsens to return to office or discuss with his PCP ?-Answered all patient questions ?-Patient to return  in 3 months for at risk foot care ?-Patient advised to call the office if any problems or questions arise in the meantime. ? ?Landis Martins, DPM ?

## 2022-03-10 ENCOUNTER — Ambulatory Visit (INDEPENDENT_AMBULATORY_CARE_PROVIDER_SITE_OTHER): Payer: PPO | Admitting: Podiatry

## 2022-03-10 ENCOUNTER — Encounter: Payer: Self-pay | Admitting: Podiatry

## 2022-03-10 DIAGNOSIS — B351 Tinea unguium: Secondary | ICD-10-CM

## 2022-03-10 DIAGNOSIS — M79674 Pain in right toe(s): Secondary | ICD-10-CM

## 2022-03-10 DIAGNOSIS — M79675 Pain in left toe(s): Secondary | ICD-10-CM

## 2022-03-10 DIAGNOSIS — E1142 Type 2 diabetes mellitus with diabetic polyneuropathy: Secondary | ICD-10-CM

## 2022-03-10 DIAGNOSIS — I739 Peripheral vascular disease, unspecified: Secondary | ICD-10-CM

## 2022-03-10 NOTE — Progress Notes (Addendum)
Subjective: Scott Anderson is a 56 y.o. male patient with history of diabetes who presents to office today complaining of long,mildly painful nails  while ambulating in shoes; unable to trim. Patient denies any new changes in medication or new problems. Still has the area on his leg that has not healed quite yet but has gotten smaller.   PCP: none   Patient Active Problem List   Diagnosis Date Noted   Chronic kidney disease, stage 3a (HCC) 12/27/2021   Chronic systolic heart failure (HCC) 12/27/2021   Impacted cerumen, bilateral 12/27/2021   Encounter for immunization 12/27/2021   Vertebrogenic low back pain 12/27/2021   Admits to alcohol consumption 11/20/2020   Benign essential hypertension 11/20/2020   Generalized anxiety disorder 11/20/2020   Mood disorder (HCC) 11/20/2020   Noncompliance with treatment 11/20/2020   Osteoarthritis of knee 11/20/2020   Pedal edema 11/20/2020   Type 2 diabetes mellitus without complication (HCC) 11/20/2020   Obstructive sleep apnea on CPAP 05/26/2019   Chronic coronary artery disease 05/25/2019   Congestive cardiomyopathy (HCC) 05/25/2019   Congestive heart failure (HCC) 05/25/2019   Elevated brain natriuretic peptide (BNP) level 04/29/2019   Pituitary insufficiency (HCC) 12/11/2015   Hypogonadism male 11/29/2015   Erectile dysfunction 11/26/2015   Morbid obesity (HCC) 11/26/2015   Diabetes (HCC) 11/26/2015   HTN (hypertension) 11/26/2015   Hypothyroidism 11/26/2015   Anxiety state 11/26/2015   Hypertriglyceridemia 11/26/2015   Current Outpatient Medications on File Prior to Visit  Medication Sig Dispense Refill   busPIRone (BUSPAR) 10 MG tablet TAKE ONE TABLET BY MOUTH THREE TIMES A DAY AS NEEDED FOR ANXIETY AS NEEDED     Cholecalciferol 25 MCG (1000 UT) tablet TAKE ONE TABLET BY MOUTH DAILY FOR LOW VIT D LEVEL     citalopram (CELEXA) 10 MG tablet TAKE 1 TABLET BY MOUTH DAILY FOR DEPRESSION     clomiPHENE (CLOMID) 50 MG tablet Take 0.5  tablets (25 mg total) by mouth daily. 10 tablet 0   diazepam (VALIUM) 5 MG tablet TK 1 T PO  TID PRN  0   digoxin (LANOXIN) 0.125 MG tablet Take 125 mcg by mouth daily.     divalproex (DEPAKOTE ER) 250 MG 24 hr tablet TAKE THREE TABLETS BY MOUTH AT BEDTIME FOR MOOD STABILIZATION     divalproex (DEPAKOTE ER) 250 MG 24 hr tablet TAKE THREE TABLETS BY MOUTH AT BEDTIME FOR MOOD STABILIZATION     doxycycline (VIBRA-TABS) 100 MG tablet Take 1 tablet (100 mg total) by mouth 2 (two) times daily. 20 tablet 0   fenofibrate 160 MG tablet TK 1 T PO D  3   furosemide (LASIX) 20 MG tablet Take 1 tablet (20 mg total) by mouth daily. 30 tablet 3   gabapentin (NEURONTIN) 100 MG capsule TAKE 2 CAPSULE BY MOUTH THREE TIMES DAILY     gabapentin (NEURONTIN) 300 MG capsule 3 (three) times daily.   1   gabapentin (NEURONTIN) 400 MG capsule Take 1 capsule by mouth 3 (three) times daily.     gemfibrozil (LOPID) 600 MG tablet Take by mouth.     insulin aspart protamine - aspart (NOVOLOG MIX 70/30 FLEXPEN) (70-30) 100 UNIT/ML FlexPen INJECT 100 UNITS SUBCUTANEOUSLY TWICE A DAY BEFORE MEALS     Insulin Glargine (TOUJEO SOLOSTAR) 300 UNIT/ML SOPN Inject 500 Units into the skin every morning. And pen needles 2/day 21 pen 11   Insulin Syringe-Needle U-100 (B-D INS SYRINGE 2CC/29GX1/2") 29G X 1/2" 2 ML MISC Used for daily insulin  injections 2x. 200 each 5   levothyroxine (SYNTHROID, LEVOTHROID) 75 MCG tablet Take 1 tablet (75 mcg total) by mouth daily. 90 tablet 3   lisinopril (PRINIVIL,ZESTRIL) 40 MG tablet TK 1 T PO D  5   metFORMIN (GLUCOPHAGE) 1000 MG tablet Take 1,000 mg by mouth 2 (two) times daily with a meal.     metFORMIN (GLUCOPHAGE-XR) 500 MG 24 hr tablet TAKE TWO TABLETS BY MOUTH TWICE A DAY FOR DIABETES (ANNUAL KIDNEY FUNCTION TESTING IS NEEDED)     metoprolol succinate (TOPROL-XL) 100 MG 24 hr tablet Take 0.5 tablets by mouth daily.     metoprolol tartrate (LOPRESSOR) 25 MG tablet   4   NYSTATIN powder APPLY  TOPICALLY TO THE AFFECTED AREA DAILY     Omega-3 1000 MG CAPS Take by mouth.     potassium chloride (K-DUR,KLOR-CON) 10 MEQ tablet Take 10 mEq by mouth 2 (two) times daily. Reported on 11/26/2015     PROAIR HFA 108 (90 Base) MCG/ACT inhaler INL 2 PFS PO Q 6 H PRN  5   rosuvastatin (CRESTOR) 40 MG tablet TAKE ONE-HALF TABLET BY MOUTH ONCE A DAY FOR CHOLESTEROL     sacubitril-valsartan (ENTRESTO) 24-26 MG Take 1 tablet by mouth 2 (two) times daily.     sildenafil (REVATIO) 20 MG tablet 2-5 tabs, as needed for ED symptoms 50 tablet 11   sulfamethoxazole-trimethoprim (BACTRIM) 400-80 MG tablet Take 1 tablet by mouth 2 (two) times daily. 28 tablet 0   testosterone cypionate (DEPOTESTOSTERONE CYPIONATE) 200 MG/ML injection INJECT 0.2 MLS INTRAMUSCULARLY EVERY 7 DAYS     torsemide (DEMADEX) 20 MG tablet Take 20 mg by mouth 2 (two) times daily.     valsartan (DIOVAN) 80 MG tablet Take 80 mg by mouth 2 (two) times daily.     No current facility-administered medications on file prior to visit.   No Known Allergies  No results found for this or any previous visit (from the past 2160 hour(s)).  Objective: General: Patient is awake, alert, and oriented x 3 and in no acute distress.  Integument: Skin is warm, dry and supple bilateral. Nails are tender, long, thickened and  dystrophic with subungual debris, consistent with onychomycosis, 1-5 bilateral. No signs of infection. + dry scabs/abrasion to legs bilateral with no acute signs of infection but there is clear oozing noted from the right medial leg. Area is open about 1 cm x 1 cm x 0.1 cm  Remaining integument unremarkable.  Vasculature:  Dorsalis Pedis pulse 0/4 bilateral. Posterior Tibial pulse 0/4 bilateral. Capillary fill time <5 sec 1-5 bilateral.No hair growth to the level of the digits.  Chronic trophic skin changes noted bilateral. Temperature gradient within normal limits. + varicosities present bilateral. + 1 pitting edema present bilateral.    Neurology: The patient has diminished pedal sensation bilateral.   Musculoskeletal: Aymptomatic pes planus pedal deformities noted bilateral. Muscular strength 5/5 in all lower extremity muscular groups bilateral without pain on range of motion . No tenderness with calf compression bilateral.  Assessment and Plan: Problem List Items Addressed This Visit   None    -Discussed and educated patient on diabetic foot care, especially with  regards to the vascular, neurological and musculoskeletal systems.  -Stressed the importance of good glycemic control and the detriment of not  controlling glucose levels in relation to the foot. -Continue with betadine and Band-Aid to area on leg and advised if does not continue to improve to come back and be seen sooner. Advised on  risks of infection and signs to look out for an if any concerns call or go to ER.  -Discussed supportive shoes at all times and checking feet regularly.  -Mechanically debrided all nails 1-5 bilateral using sterile nail nipper and filed with dremel without incident  -Answered all patient questions -Patient to return  in 3 months for at risk foot care -Patient advised to call the office if any problems or questions arise in the meantime.   Louann Sjogren, DPM

## 2022-04-30 ENCOUNTER — Encounter: Payer: Self-pay | Admitting: Podiatrist

## 2022-04-30 ENCOUNTER — Ambulatory Visit: Payer: PPO | Admitting: Podiatrist

## 2022-04-30 DIAGNOSIS — M79675 Pain in left toe(s): Secondary | ICD-10-CM

## 2022-04-30 DIAGNOSIS — B351 Tinea unguium: Secondary | ICD-10-CM

## 2022-04-30 DIAGNOSIS — E1142 Type 2 diabetes mellitus with diabetic polyneuropathy: Secondary | ICD-10-CM | POA: Diagnosis not present

## 2022-04-30 DIAGNOSIS — M79674 Pain in right toe(s): Secondary | ICD-10-CM

## 2022-04-30 DIAGNOSIS — I739 Peripheral vascular disease, unspecified: Secondary | ICD-10-CM

## 2022-04-30 NOTE — Progress Notes (Signed)
Subjective: Scott Anderson is a 56 y.o. male patient with history of diabetes who presents to office today complaining of long,mildly painful nails  while ambulating in shoes; unable to trim. Patient denies any new changes in medication or new problems. swelling on bilateral lower legs is noted with some areas of light drainage of which he is covering with a bandaid.      Current Outpatient Medications on File Prior to Visit  Medication Sig Dispense Refill   busPIRone (BUSPAR) 10 MG tablet TAKE ONE TABLET BY MOUTH THREE TIMES A DAY AS NEEDED FOR ANXIETY AS NEEDED     Cholecalciferol 25 MCG (1000 UT) tablet TAKE ONE TABLET BY MOUTH DAILY FOR LOW VIT D LEVEL     citalopram (CELEXA) 10 MG tablet TAKE 1 TABLET BY MOUTH DAILY FOR DEPRESSION     clomiPHENE (CLOMID) 50 MG tablet Take 0.5 tablets (25 mg total) by mouth daily. 10 tablet 0   diazepam (VALIUM) 5 MG tablet TK 1 T PO  TID PRN  0   digoxin (LANOXIN) 0.125 MG tablet Take 125 mcg by mouth daily.     divalproex (DEPAKOTE ER) 250 MG 24 hr tablet TAKE THREE TABLETS BY MOUTH AT BEDTIME FOR MOOD STABILIZATION     divalproex (DEPAKOTE ER) 250 MG 24 hr tablet TAKE THREE TABLETS BY MOUTH AT BEDTIME FOR MOOD STABILIZATION     doxycycline (VIBRA-TABS) 100 MG tablet Take 1 tablet (100 mg total) by mouth 2 (two) times daily. 20 tablet 0   fenofibrate 160 MG tablet TK 1 T PO D  3   furosemide (LASIX) 20 MG tablet Take 1 tablet (20 mg total) by mouth daily. 30 tablet 3   gabapentin (NEURONTIN) 100 MG capsule TAKE 2 CAPSULE BY MOUTH THREE TIMES DAILY     gabapentin (NEURONTIN) 300 MG capsule 3 (three) times daily.   1   gabapentin (NEURONTIN) 400 MG capsule Take 1 capsule by mouth 3 (three) times daily.     gemfibrozil (LOPID) 600 MG tablet Take by mouth.     insulin aspart protamine - aspart (NOVOLOG MIX 70/30 FLEXPEN) (70-30) 100 UNIT/ML FlexPen INJECT 100 UNITS SUBCUTANEOUSLY TWICE A DAY BEFORE MEALS     Insulin Glargine (TOUJEO SOLOSTAR) 300 UNIT/ML SOPN  Inject 500 Units into the skin every morning. And pen needles 2/day 21 pen 11   Insulin Syringe-Needle U-100 (B-D INS SYRINGE 2CC/29GX1/2") 29G X 1/2" 2 ML MISC Used for daily insulin injections 2x. 200 each 5   levothyroxine (SYNTHROID, LEVOTHROID) 75 MCG tablet Take 1 tablet (75 mcg total) by mouth daily. 90 tablet 3   lisinopril (PRINIVIL,ZESTRIL) 40 MG tablet TK 1 T PO D  5   metFORMIN (GLUCOPHAGE) 1000 MG tablet Take 1,000 mg by mouth 2 (two) times daily with a meal.     metFORMIN (GLUCOPHAGE-XR) 500 MG 24 hr tablet TAKE TWO TABLETS BY MOUTH TWICE A DAY FOR DIABETES (ANNUAL KIDNEY FUNCTION TESTING IS NEEDED)     metoprolol succinate (TOPROL-XL) 100 MG 24 hr tablet Take 0.5 tablets by mouth daily.     metoprolol tartrate (LOPRESSOR) 25 MG tablet   4   NYSTATIN powder APPLY TOPICALLY TO THE AFFECTED AREA DAILY     Omega-3 1000 MG CAPS Take by mouth.     potassium chloride (K-DUR,KLOR-CON) 10 MEQ tablet Take 10 mEq by mouth 2 (two) times daily. Reported on 11/26/2015     PROAIR HFA 108 (90 Base) MCG/ACT inhaler INL 2 PFS PO Q 6 H  PRN  5   rosuvastatin (CRESTOR) 40 MG tablet TAKE ONE-HALF TABLET BY MOUTH ONCE A DAY FOR CHOLESTEROL     sacubitril-valsartan (ENTRESTO) 24-26 MG Take 1 tablet by mouth 2 (two) times daily.     sildenafil (REVATIO) 20 MG tablet 2-5 tabs, as needed for ED symptoms 50 tablet 11   sulfamethoxazole-trimethoprim (BACTRIM) 400-80 MG tablet Take 1 tablet by mouth 2 (two) times daily. 28 tablet 0   testosterone cypionate (DEPOTESTOSTERONE CYPIONATE) 200 MG/ML injection INJECT 0.2 MLS INTRAMUSCULARLY EVERY 7 DAYS     torsemide (DEMADEX) 20 MG tablet Take 20 mg by mouth 2 (two) times daily.     valsartan (DIOVAN) 80 MG tablet Take 80 mg by mouth 2 (two) times daily.     No current facility-administered medications on file prior to visit.      No current facility-administered medications on file prior to visit.    No Known Allergies     Objective: General: Patient is  awake, alert, and oriented x 3 and in no acute distress.   Integument: Skin is warm, dry and supple bilateral. Nails are tender, long, thickened and  dystrophic with subungual debris, consistent with onychomycosis, 1-5 bilateral. No signs of infection. + dry scabs/abrasion to legs bilateral with no acute signs of infection but there is clear oozing noted from the right medial leg.   Vasculature:  Dorsalis Pedis pulse 0/4 bilateral. Posterior Tibial pulse 0/4 bilateral. Capillary fill time <5 sec 1-5 bilateral.No hair growth to the level of the digits.  Chronic trophic skin changes noted bilateral. Temperature gradient within normal limits. + varicosities present bilateral. + 1 pitting edema present bilateral.    Neurology: The patient has diminished pedal sensation bilateral.    Musculoskeletal: Aymptomatic pes planus pedal deformities noted bilateral. Muscular strength 5/5 in all lower extremity muscular groups bilateral without pain on range of motion . No tenderness with calf compression bilateral.  ASSESSMENT:      ICD-10-CM   1. Pain due to onychomycosis of toenails of both feet  B35.1    M79.675    M79.674     2. PVD (peripheral vascular disease) (HCC)  I73.9     3. Diabetic polyneuropathy associated with type 2 diabetes mellitus (HCC)  E11.42        -Discussed supportive shoes at all times and checking feet regularly.  -Mechanically debrided all nails 1-5 bilateral using sterile nail nipper and filed with dremel without incident  -Answered all patient questions -Patient to return  in 3 months for at risk foot care -Patient advised to call the office if any problems or questions arise in the meantime.

## 2022-06-03 ENCOUNTER — Telehealth: Payer: Self-pay

## 2022-06-03 NOTE — Telephone Encounter (Signed)
        Patient  visited [place] on 9/21 Telephone encounter attempt :  1st  A HIPAA compliant voice message was left requesting a return call.  Instructed patient to call back    [facility]  [phone] [address], [place], [place] [address] Phone: [phone] Email: [email or web address]

## 2022-08-01 ENCOUNTER — Ambulatory Visit: Payer: PPO | Admitting: Podiatry

## 2022-08-04 ENCOUNTER — Ambulatory Visit: Payer: PPO | Admitting: Podiatry

## 2022-08-04 DIAGNOSIS — M79674 Pain in right toe(s): Secondary | ICD-10-CM | POA: Diagnosis not present

## 2022-08-04 DIAGNOSIS — B351 Tinea unguium: Secondary | ICD-10-CM | POA: Diagnosis not present

## 2022-08-04 DIAGNOSIS — M79675 Pain in left toe(s): Secondary | ICD-10-CM | POA: Diagnosis not present

## 2022-08-04 DIAGNOSIS — E1142 Type 2 diabetes mellitus with diabetic polyneuropathy: Secondary | ICD-10-CM

## 2022-08-04 DIAGNOSIS — I739 Peripheral vascular disease, unspecified: Secondary | ICD-10-CM

## 2022-08-04 NOTE — Progress Notes (Signed)
  Subjective:  Patient ID: Scott Anderson, male    DOB: 25-Feb-1966,  MRN: [id number]  Chief Complaint  Patient presents with   Nail Problem    20mo [facility]    56 y.o. male presents with the above complaint. History confirmed with patient. Patient presenting with pain related to dystrophic thickened elongated nails. Patient is unable to trim own nails related to nail dystrophy and/or mobility issues. Patient does  have a history of T2DM.   Objective:  Physical Exam: warm, good capillary refill nail exam onychomycosis of the toenails, onycholysis, and dystrophic nails DP pulses palpable, PT pulses palpable, and protective sensation absent Left Foot:  Pain with palpation of nails due to elongation and dystrophic growth.  Right Foot: Pain with palpation of nails due to elongation and dystrophic growth.   Assessment:   1. Pain due to onychomycosis of toenails of both feet   2. PVD (peripheral vascular disease) (HCC)   3. Diabetic polyneuropathy associated with type 2 diabetes mellitus (HCC)      Plan:  Patient was evaluated and treated and all questions answered.  #Onychomycosis with pain  -Nails palliatively debrided as below. -Educated on self-care  Procedure: Nail Debridement Rationale: Pain Type of Debridement: manual, sharp debridement. Instrumentation: Nail nipper, rotary burr. Number of Nails: 10  Return in about 3 months (around 11/03/2022) for [facility].         Corinna Gab, DPM [facility] / [facility]

## 2022-11-10 ENCOUNTER — Ambulatory Visit (INDEPENDENT_AMBULATORY_CARE_PROVIDER_SITE_OTHER): Payer: Medicare PPO | Admitting: Podiatry

## 2022-11-10 DIAGNOSIS — I739 Peripheral vascular disease, unspecified: Secondary | ICD-10-CM

## 2022-11-10 DIAGNOSIS — M79674 Pain in right toe(s): Secondary | ICD-10-CM | POA: Diagnosis not present

## 2022-11-10 DIAGNOSIS — M79675 Pain in left toe(s): Secondary | ICD-10-CM | POA: Diagnosis not present

## 2022-11-10 DIAGNOSIS — B351 Tinea unguium: Secondary | ICD-10-CM

## 2022-11-10 DIAGNOSIS — E1142 Type 2 diabetes mellitus with diabetic polyneuropathy: Secondary | ICD-10-CM

## 2022-11-10 NOTE — Progress Notes (Signed)
  Subjective:  Patient ID: Scott Anderson, male    DOB: 10-Oct-1965,  MRN: [id number]  Chief Complaint  Patient presents with   Nail Problem    Diabetic Foot Care     57 y.o. male presents with the above complaint. History confirmed with patient. Patient presenting with pain related to dystrophic thickened elongated nails. Patient is unable to trim own nails related to nail dystrophy and/or mobility issues. Patient does  have a history of T2DM.   Objective:  Physical Exam: warm, good capillary refill nail exam onychomycosis of the toenails, onycholysis, and dystrophic nails DP pulses palpable, PT pulses palpable, and protective sensation absent Left Foot:  Pain with palpation of nails due to elongation and dystrophic growth.  Right Foot: Pain with palpation of nails due to elongation and dystrophic growth.   Assessment:   1. Pain due to onychomycosis of toenails of both feet   2. PVD (peripheral vascular disease) ([place])   3. Diabetic polyneuropathy associated with type 2 diabetes mellitus ([place])       Plan:  Patient was evaluated and treated and all questions answered.  #Onychomycosis with pain  -Nails palliatively debrided as below. -Educated on self-care  Procedure: Nail Debridement Rationale: Pain Type of Debridement: manual, sharp debridement. Instrumentation: Nail nipper, rotary burr. Number of Nails: 10  Return in about 3 months (around 02/10/2023) for [facility].         Everitt Amber, DPM [facility]

## 2022-12-09 ENCOUNTER — Telehealth: Payer: Self-pay

## 2022-12-09 NOTE — Telephone Encounter (Signed)
     Patient  visit on 12/05/2022  at [facility] was for treatment.  Have you been able to follow up with your primary care physician? Patient stated he is feeling better.  The patient was or was not able to obtain any needed medicine or equipment. Patient was able to obtain medication.  Are there diet recommendations that you are having difficulty following? No  Patient expresses understanding of discharge instructions and education provided has no other needs at this time. Yes   Sharnay Cashion [facility]  [facility] Population Health Community Resource Care Guide   [email or web address]  ?? [phone]   Website: [email or web address]  [email or web address]

## 2023-02-10 ENCOUNTER — Ambulatory Visit: Payer: Medicare PPO | Admitting: Podiatry

## 2023-02-10 DIAGNOSIS — M79674 Pain in right toe(s): Secondary | ICD-10-CM

## 2023-02-10 DIAGNOSIS — B351 Tinea unguium: Secondary | ICD-10-CM

## 2023-02-10 DIAGNOSIS — M79675 Pain in left toe(s): Secondary | ICD-10-CM

## 2023-02-10 DIAGNOSIS — I739 Peripheral vascular disease, unspecified: Secondary | ICD-10-CM

## 2023-02-10 DIAGNOSIS — E1142 Type 2 diabetes mellitus with diabetic polyneuropathy: Secondary | ICD-10-CM

## 2023-02-10 NOTE — Progress Notes (Signed)
  Subjective:  Patient ID: Scott Anderson, male    DOB: 1966/03/23,  MRN: [id number]   57 y.o. male presents with the above complaint. History confirmed with patient. Patient presenting with pain related to dystrophic thickened elongated nails. Patient is unable to trim own nails related to nail dystrophy and/or mobility issues. Patient does  have a history of T2DM.   Objective:  Physical Exam: warm, good capillary refill nail exam onychomycosis of the toenails, onycholysis, and dystrophic nails DP pulses palpable, PT pulses palpable, and protective sensation absent Left Foot:  Pain with palpation of nails due to elongation and dystrophic growth.  Right Foot: Pain with palpation of nails due to elongation and dystrophic growth.   Assessment:   1. Pain due to onychomycosis of toenails of both feet   2. PVD (peripheral vascular disease) (HCC)   3. Diabetic polyneuropathy associated with type 2 diabetes mellitus (HCC)        Plan:  Patient was evaluated and treated and all questions answered.  #Onychomycosis with pain  -Nails palliatively debrided as below. -Educated on self-care  Procedure: Nail Debridement Rationale: Pain Type of Debridement: manual, sharp debridement. Instrumentation: Nail nipper, rotary burr. Number of Nails: 10  Return in about 3 months (around 05/13/2023) for [facility].         Corinna Gab, DPM [facility] / [facility]

## 2023-05-12 ENCOUNTER — Ambulatory Visit (INDEPENDENT_AMBULATORY_CARE_PROVIDER_SITE_OTHER): Payer: Medicare PPO | Admitting: Podiatry

## 2023-05-12 DIAGNOSIS — Z91199 Patient's noncompliance with other medical treatment and regimen due to unspecified reason: Secondary | ICD-10-CM

## 2023-05-12 NOTE — Progress Notes (Signed)
 Patient absent for apointment

## 2023-08-11 ENCOUNTER — Ambulatory Visit: Payer: Medicare PPO | Admitting: Podiatry

## 2023-08-31 ENCOUNTER — Ambulatory Visit (INDEPENDENT_AMBULATORY_CARE_PROVIDER_SITE_OTHER): Payer: Medicare PPO | Admitting: Podiatry

## 2023-08-31 DIAGNOSIS — M79674 Pain in right toe(s): Secondary | ICD-10-CM | POA: Diagnosis not present

## 2023-08-31 DIAGNOSIS — B351 Tinea unguium: Secondary | ICD-10-CM

## 2023-08-31 DIAGNOSIS — M79675 Pain in left toe(s): Secondary | ICD-10-CM

## 2023-08-31 DIAGNOSIS — I739 Peripheral vascular disease, unspecified: Secondary | ICD-10-CM

## 2023-08-31 DIAGNOSIS — E1142 Type 2 diabetes mellitus with diabetic polyneuropathy: Secondary | ICD-10-CM

## 2023-08-31 NOTE — Progress Notes (Signed)
  Subjective:  Patient ID: Scott Anderson, male    DOB: 08/14/1966,  MRN: [id number]   57 y.o. male presents with the above complaint. History confirmed with patient. Patient presenting with pain related to dystrophic thickened elongated nails. Patient is unable to trim own nails related to nail dystrophy and/or mobility issues. Patient does  have a history of T2DM. Last A1c 8.1, previously approximately 14.  Objective:  Physical Exam: warm, good capillary refill nail exam onychomycosis of the toenails, onycholysis, and dystrophic nails DP pulses palpable, PT pulses palpable, and protective sensation absent Left Foot:  Pain with palpation of nails due to elongation and dystrophic growth.  Right Foot: Pain with palpation of nails due to elongation and dystrophic growth.   Assessment:   1. Pain due to onychomycosis of toenails of both feet   2. PVD (peripheral vascular disease) (HCC)   3. Diabetic polyneuropathy associated with type 2 diabetes mellitus (HCC)        Plan:  Patient was evaluated and treated and all questions answered.  #Onychomycosis with pain  -Nails palliatively debrided as below. -Educated on self-care - Mild bleeding to left hallux, bacitracin applied, bandaid applied.  Procedure: Nail Debridement Rationale: Pain Type of Debridement: manual, sharp debridement. Instrumentation: Nail nipper, rotary burr. Number of Nails: 10  Return in about 3 months (around 11/29/2023) for Diabetic Foot Care.         Bronwen Betters, DPM [facility] / [facility]

## 2023-11-30 ENCOUNTER — Ambulatory Visit: Payer: Medicare PPO | Admitting: Podiatry

## 2024-02-08 ENCOUNTER — Encounter: Payer: Self-pay | Admitting: Podiatry

## 2024-02-08 ENCOUNTER — Ambulatory Visit: Admitting: Podiatry

## 2024-02-08 DIAGNOSIS — M79675 Pain in left toe(s): Secondary | ICD-10-CM | POA: Diagnosis not present

## 2024-02-08 DIAGNOSIS — E1142 Type 2 diabetes mellitus with diabetic polyneuropathy: Secondary | ICD-10-CM

## 2024-02-08 DIAGNOSIS — B351 Tinea unguium: Secondary | ICD-10-CM

## 2024-02-08 DIAGNOSIS — M79674 Pain in right toe(s): Secondary | ICD-10-CM

## 2024-02-08 DIAGNOSIS — L600 Ingrowing nail: Secondary | ICD-10-CM

## 2024-02-08 MED ORDER — MUPIROCIN 2 % EX OINT: 1.0000 | TOPICAL_OINTMENT | Freq: Two times a day (BID) | CUTANEOUS | 2 refills | Status: DC

## 2024-02-08 NOTE — Progress Notes (Unsigned)
  Subjective:  Patient ID: Scott Anderson, male    DOB: 1966-08-30,  MRN: [id number]   58 y.o. male presents with the above complaint. History confirmed with patient. Patient presenting with pain related to dystrophic thickened elongated nails. Patient is unable to trim own nails related to nail dystrophy and/or mobility issues. Patient does  have a history of T2DM. Last A1c 8.5, previously approximately 14.  Objective:  Physical Exam: warm, good capillary refill nail exam onychomycosis of the toenails, onycholysis, and dystrophic nails DP pulses palpable, PT pulses palpable, and protective sensation absent Left Foot:  Pain with palpation of nails due to elongation and dystrophic growth.  Right Foot: Pain with palpation of nails due to elongation and dystrophic growth.  Ingrowing toenail to right hallux medial border with dried blood present.  Assessment:   1. Pain due to onychomycosis of toenails of both feet   2. Diabetic polyneuropathy associated with type 2 diabetes mellitus (HCC)   3. Ingrown toenail of right foot        Plan:  Patient was evaluated and treated and all questions answered.  #Onychomycosis with pain  -Nails palliatively debrided as below. -Educated on self-care - Slant back nail trim right hallux medial border, slight bleeding, bacitracin Band-Aid applied.  Procedure: Nail Debridement Rationale: Pain Type of Debridement: manual, sharp debridement. Instrumentation: Nail nipper, rotary burr. Number of Nails: 10  # Diabetes with neuropathy Patient educated on diabetes. Discussed proper diabetic foot care and discussed risks and complications of disease. Educated patient in depth on reasons to return to the office immediately should he/she discover anything concerning or new on the feet. All questions answered. Discussed proper shoes as well.    Return in about 3 months (around 05/10/2024) for Diabetic Foot Care.         Eve Hinders, DPM [facility]

## 2024-05-09 ENCOUNTER — Ambulatory Visit (INDEPENDENT_AMBULATORY_CARE_PROVIDER_SITE_OTHER): Admitting: Podiatry

## 2024-05-09 ENCOUNTER — Encounter: Payer: Self-pay | Admitting: Podiatry

## 2024-05-09 ENCOUNTER — Ambulatory Visit (INDEPENDENT_AMBULATORY_CARE_PROVIDER_SITE_OTHER)

## 2024-05-09 DIAGNOSIS — L02611 Cutaneous abscess of right foot: Secondary | ICD-10-CM

## 2024-05-09 DIAGNOSIS — I739 Peripheral vascular disease, unspecified: Secondary | ICD-10-CM | POA: Diagnosis not present

## 2024-05-09 DIAGNOSIS — M79674 Pain in right toe(s): Secondary | ICD-10-CM | POA: Diagnosis not present

## 2024-05-09 DIAGNOSIS — B351 Tinea unguium: Secondary | ICD-10-CM | POA: Diagnosis not present

## 2024-05-09 DIAGNOSIS — M79675 Pain in left toe(s): Secondary | ICD-10-CM

## 2024-05-09 DIAGNOSIS — E1142 Type 2 diabetes mellitus with diabetic polyneuropathy: Secondary | ICD-10-CM

## 2024-05-09 DIAGNOSIS — L03031 Cellulitis of right toe: Secondary | ICD-10-CM | POA: Diagnosis not present

## 2024-05-09 MED ORDER — MUPIROCIN 2 % EX OINT: 1.0000 | TOPICAL_OINTMENT | Freq: Two times a day (BID) | CUTANEOUS | 2 refills | Status: DC

## 2024-05-09 MED ORDER — DOXYCYCLINE HYCLATE 100 MG PO TABS: 100.0000 mg | ORAL_TABLET | Freq: Two times a day (BID) | ORAL | 0 refills | Status: DC

## 2024-05-09 NOTE — Progress Notes (Unsigned)
 Chief Complaint  Patient presents with   [facility]    [facility] no callous, right hallux and 2nd medial nail borders are ingrown and draining. Hallux nail is lose. A1c 8.5 yesterday VIA Dex Com No anti coag    HPI: 58 y.o. male presents today for diabetic footcare.  He has redness and swelling to the right first toe around the medial border of the hallux nail.  States that he has noticed it for close to a week.  Also has some concerns about mild redness about the adjacent second toe.  The remaining toenails are painful, thickened, elongated, dystrophic with yellow discoloration subungual debris.  Last A1c 8.5.  Past Medical History:  Diagnosis Date   Diabetes mellitus without complication (HCC)    Hypertension    Neuropathy     History reviewed. No pertinent surgical history.  Allergies  Allergen Reactions   [place] Clam (M. Mercenaria) Skin Test Nausea And Vomiting    ROS    Physical Exam: There were no vitals filed for this visit.  General: The patient is alert and oriented x3 in no acute distress.  Dermatology: Nailplates x 10 are thickened, elongated, dystrophic with yellow discoloration and subungual debris.  They are tender on direct dorsal palpation.  The right hallux medial nail border is incurvated with some accompanying erythema and edema with some odor present.  No frank purulence.  Some mild bloody drainage present.  Vascular: DP and PT faintly palpable pedal pulses bilaterally. Capillary refill within normal limits.  N decreased pedal hair growth.  Neurological: Light touch sensation grossly diminished to toes.  Protective sensation decreased.  Musculoskeletal Exam: No pedal deformities noted.  Decreased muscle strength.  Ambulates with the assistance of canes.  Radiographic Exam: Right foot 3 views weightbearing 05/09/2024 Normal osseous mineralization. Joint spaces preserved.  No fractures or acute osseous irregularities noted.  Assessment/Plan of  Care: 1. Pain due to onychomycosis of toenails of both feet   2. Diabetic polyneuropathy associated with type 2 diabetes mellitus (HCC)   3. Cellulitis and abscess of toe of right foot   4. PVD (peripheral vascular disease) (HCC)      Meds ordered this encounter  Medications   doxycycline  (VIBRA -TABS) 100 MG tablet    Sig: Take 1 tablet (100 mg total) by mouth 2 (two) times daily for 14 days.    Dispense:  28 tablet    Refill:  0   mupirocin  ointment (BACTROBAN ) 2 %    Sig: Apply 1 Application topically 2 (two) times daily.    Dispense:  30 g    Refill:  2   VAS US  ABI WITH/WO TBI  Discussed clinical findings with patient today.  Concern for cellulitis right first toenail.  Slant back nail trim performed today.  Did remove all offending nail.  Prescribing doxycycline  but patient, 2-week course.  Antibiotic manage applied today.  Prescribing topical mupirocin  as well 2.  He may need assistance of family to perform home wound care.  Monitor for signs of worsening infection.  Contact office if this develops.  Does have diabetes with A1c of 8.5, CKD stage III, CHF and decreased mobility.  Order a baseline ABIs for patient.  #Onychomycosis with pain  -Nails palliatively debrided as below. -Educated on self-care  Procedure: Nail Debridement Rationale: Pain Type of Debridement: manual, sharp debridement. Instrumentation: Nail nipper, rotary burr. Number of Nails: 10   Reevaluate about 2 weeks.   Kaynen Minner L. Lamount, DPM, AACFAS [facility]     [address] [place], [place] [address]                Office [phone]  Fax [phone]

## 2024-05-11 ENCOUNTER — Telehealth: Payer: Self-pay | Admitting: Lab

## 2024-05-11 ENCOUNTER — Telehealth: Payer: Self-pay | Admitting: Podiatry

## 2024-05-11 NOTE — Telephone Encounter (Signed)
 Patient called and stated he went to go pick up prescription but was unable to pay due to price being high. He requested a alternative be sent in. I aske patient if it was the pills or the ointment and he was unsure. Advised patient to reach out to pharmacy and see which prescription it was so we could ask provider to send in alternative.

## 2024-05-11 NOTE — Telephone Encounter (Signed)
 Patient states he can not afford medication for his toe infection and states if the medication is sent to [facility] in [place] there is no cost can you sent his medication to the [place]?

## 2024-05-11 NOTE — Patient Instructions (Signed)

## 2024-05-23 ENCOUNTER — Encounter: Payer: Self-pay | Admitting: Podiatry

## 2024-05-23 ENCOUNTER — Ambulatory Visit (INDEPENDENT_AMBULATORY_CARE_PROVIDER_SITE_OTHER): Admitting: Podiatry

## 2024-05-23 DIAGNOSIS — L600 Ingrowing nail: Secondary | ICD-10-CM

## 2024-05-23 MED ORDER — DOXYCYCLINE HYCLATE 100 MG PO TABS: 100.0000 mg | ORAL_TABLET | Freq: Two times a day (BID) | ORAL | 0 refills | Status: AC

## 2024-05-23 MED ORDER — MUPIROCIN 2 % EX OINT: 1.0000 | TOPICAL_OINTMENT | Freq: Two times a day (BID) | CUTANEOUS | 2 refills | Status: AC

## 2024-05-23 NOTE — Progress Notes (Unsigned)
       Chief Complaint  Patient presents with   Follow-up    Right 1st toe follow up. Patient reports decrease in drainage and redness. There is some redness present. Per patient, he was never notified of need to pick up printed RX so he never started the medication that was prescribed.     HPI: 58 y.o. male presents today following up for cellulitis of right first toe with ingrowing toenail medial border.  Does appear to be doing a bit better today with less swelling and less redness.  He is diabetic, does have venous wounds with multilayer compression wrap applied.  Was unable to obtain the prescription for doxycycline .  Past Medical History:  Diagnosis Date   Diabetes mellitus without complication (HCC)    Hypertension    Neuropathy     History reviewed. No pertinent surgical history.  Allergies  Allergen Reactions   [place] Clam (M. Mercenaria) Skin Test Nausea And Vomiting    ROS    Physical Exam: There were no vitals filed for this visit.  General: The patient is alert and oriented x3 in no acute distress.  Dermatology: Mycotic nail plates x 10.  Right first toe residual mild erythema, appears post cellulitic.  No drainage.  No odor.  Mildly edematous.  Right hallux medial nail border scabbed over.  Vascular: DP and PT faintly palpable pedal pulses bilaterally. Capillary refill within normal limits.  Decreased pedal hair growth.  Neurological: Light touch sensation grossly diminished to toes.  Protective sensation decreased.  Musculoskeletal Exam: No pedal deformities noted.  Decreased muscle strength.  Ambulates with the assistance of canes.  Radiographic Exam: Right foot 3 views weightbearing 05/09/2024 Normal osseous mineralization. Joint spaces preserved.  No fractures or acute osseous irregularities noted.  Assessment/Plan of Care: 1. Ingrown toenail of right foot      Meds ordered this encounter  Medications   mupirocin  ointment (BACTROBAN ) 2 %     Sig: Apply 1 Application topically 2 (two) times daily.    Dispense:  30 g    Refill:  2   doxycycline  (VIBRA -TABS) 100 MG tablet    Sig: Take 1 tablet (100 mg total) by mouth 2 (two) times daily for 14 days.    Dispense:  28 tablet    Refill:  0   None  Discussed clinical findings with patient today.  Does appear improved following slant back nail trim.  Do think that he would benefit from 10 to 14-day course of oral doxycycline  out of abundance of caution.  Sent prescription to [facility] pharmacy today.  Prescription for the Silvadene cream sent as well to be applied to the nail border.  Did offer follow-up in about 2 weeks to make sure any infection is resolving.  He is deferring this, we will see him for diabetic footcare in about 10 weeks.   Camren Lipsett L. Lamount MAUL, AACFAS [facility]     [address] [place], [place] [address]                Office [phone]  Fax [phone]

## 2024-06-10 ENCOUNTER — Ambulatory Visit

## 2024-06-30 ENCOUNTER — Ambulatory Visit: Attending: Podiatry

## 2024-08-08 ENCOUNTER — Ambulatory Visit: Admitting: Podiatry
